# Patient Record
Sex: Male | Born: 1965
Health system: Southern US, Community
[De-identification: ages and names within clinical notes are randomized; demographics above are authoritative.]

## PROBLEM LIST (undated history)

## (undated) DIAGNOSIS — I509 Heart failure, unspecified: Secondary | ICD-10-CM

## (undated) DIAGNOSIS — F329 Major depressive disorder, single episode, unspecified: Secondary | ICD-10-CM

## (undated) DIAGNOSIS — F32A Depression, unspecified: Secondary | ICD-10-CM

## (undated) DIAGNOSIS — I429 Cardiomyopathy, unspecified: Secondary | ICD-10-CM

## (undated) HISTORY — PX: HAND SURGERY: SHX662

## (undated) HISTORY — PX: CARDIAC DEFIBRILLATOR PLACEMENT: SHX171

---

## 2013-12-27 ENCOUNTER — Emergency Department (HOSPITAL_COMMUNITY)
Admission: EM | Admit: 2013-12-27 | Discharge: 2013-12-27 | Disposition: A | Discharge status: Home / Self Care | Payer: Medicare (Managed Care) | Attending: Emergency Medicine | Admitting: Emergency Medicine

## 2013-12-27 ENCOUNTER — Encounter (HOSPITAL_COMMUNITY): Payer: Self-pay | Admitting: Emergency Medicine

## 2013-12-27 DIAGNOSIS — F3289 Other specified depressive episodes: Secondary | ICD-10-CM | POA: Insufficient documentation

## 2013-12-27 DIAGNOSIS — I428 Other cardiomyopathies: Secondary | ICD-10-CM | POA: Diagnosis not present

## 2013-12-27 DIAGNOSIS — R209 Unspecified disturbances of skin sensation: Secondary | ICD-10-CM | POA: Diagnosis present

## 2013-12-27 DIAGNOSIS — R208 Other disturbances of skin sensation: Secondary | ICD-10-CM

## 2013-12-27 DIAGNOSIS — Z7982 Long term (current) use of aspirin: Secondary | ICD-10-CM | POA: Diagnosis not present

## 2013-12-27 DIAGNOSIS — F329 Major depressive disorder, single episode, unspecified: Secondary | ICD-10-CM | POA: Diagnosis not present

## 2013-12-27 DIAGNOSIS — Z79899 Other long term (current) drug therapy: Secondary | ICD-10-CM | POA: Diagnosis not present

## 2013-12-27 DIAGNOSIS — I509 Heart failure, unspecified: Secondary | ICD-10-CM | POA: Insufficient documentation

## 2013-12-27 DIAGNOSIS — Z95810 Presence of automatic (implantable) cardiac defibrillator: Secondary | ICD-10-CM | POA: Insufficient documentation

## 2013-12-27 HISTORY — DX: Heart failure, unspecified: I50.9

## 2013-12-27 HISTORY — DX: Major depressive disorder, single episode, unspecified: F32.9

## 2013-12-27 HISTORY — DX: Depression, unspecified: F32.A

## 2013-12-27 HISTORY — DX: Cardiomyopathy, unspecified: I42.9
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
## 2013-12-27 NOTE — ED Notes (Signed)
ICD integrated and information transmitted via phone line
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
## 2015-03-12 DIAGNOSIS — Z95810 Presence of automatic (implantable) cardiac defibrillator: Secondary | ICD-10-CM

## 2016-09-06 ENCOUNTER — Ambulatory Visit: Payer: BLUE CROSS/BLUE SHIELD

## 2016-09-06 ENCOUNTER — Ambulatory Visit: Payer: MEDICARE

## 2016-09-06 DIAGNOSIS — M2021 Hallux rigidus, right foot: Secondary | ICD-10-CM

## 2016-09-06 DIAGNOSIS — M79671 Pain in right foot: Secondary | ICD-10-CM

## 2016-11-01 MED ORDER — METOPROLOL SUCCINATE ER 50 MG PO TB24
ORAL_TABLET | 0 refills
Start: 2016-11-01 — End: ?

## 2016-11-08 MED ORDER — METOPROLOL SUCCINATE ER 50 MG PO TB24
ORAL_TABLET | 0 refills
Start: 2016-11-08 — End: ?

## 2016-11-11 ENCOUNTER — Encounter (HOSPITAL_COMMUNITY): Payer: Self-pay | Admitting: Emergency Medicine

## 2016-11-11 ENCOUNTER — Emergency Department (HOSPITAL_COMMUNITY): Payer: Medicare Other

## 2016-11-11 ENCOUNTER — Emergency Department (HOSPITAL_COMMUNITY)
Admission: EM | Admit: 2016-11-11 | Discharge: 2016-11-11 | Disposition: A | Discharge status: Home / Self Care | Payer: Medicare Other | Attending: Emergency Medicine | Admitting: Emergency Medicine

## 2016-11-11 DIAGNOSIS — I509 Heart failure, unspecified: Secondary | ICD-10-CM | POA: Diagnosis not present

## 2016-11-11 DIAGNOSIS — Z4502 Encounter for adjustment and management of automatic implantable cardiac defibrillator: Secondary | ICD-10-CM

## 2016-11-11 DIAGNOSIS — R072 Precordial pain: Secondary | ICD-10-CM

## 2016-11-11 DIAGNOSIS — T82191A Other mechanical complication of cardiac pulse generator (battery), initial encounter: Secondary | ICD-10-CM | POA: Insufficient documentation

## 2016-11-11 DIAGNOSIS — Z45010 Encounter for checking and testing of cardiac pacemaker pulse generator [battery]: Secondary | ICD-10-CM

## 2016-11-11 DIAGNOSIS — Y712 Prosthetic and other implants, materials and accessory cardiovascular devices associated with adverse incidents: Secondary | ICD-10-CM | POA: Diagnosis not present

## 2016-11-11 DIAGNOSIS — Z79899 Other long term (current) drug therapy: Secondary | ICD-10-CM | POA: Insufficient documentation

## 2016-11-11 DIAGNOSIS — Z7982 Long term (current) use of aspirin: Secondary | ICD-10-CM | POA: Insufficient documentation

## 2016-11-11 LAB — BASIC METABOLIC PANEL
ANION GAP: 9 (ref 5–15)
BUN: 9 mg/dL (ref 6–20)
CO2: 31 mmol/L (ref 22–32)
Calcium: 9.1 mg/dL (ref 8.9–10.3)
Chloride: 99 mmol/L — ABNORMAL LOW (ref 101–111)
Creatinine, Ser: 0.84 mg/dL (ref 0.61–1.24)
GFR calc Af Amer: >60 mL/min (ref >60)
GFR calc non Af Amer: >60 mL/min (ref >60)
GLUCOSE: 111 mg/dL — AB (ref 65–99)
Potassium: 3.4 mmol/L — ABNORMAL LOW (ref 3.5–5.1)
Sodium: 139 mmol/L (ref 135–145)

## 2016-11-11 LAB — CBC
HEMATOCRIT: 44.7 % (ref 39.0–52.0)
HEMOGLOBIN: 15 g/dL (ref 13.0–17.0)
MCH: 29.9 pg (ref 26.0–34.0)
MCHC: 33.6 g/dL (ref 30.0–36.0)
MCV: 89 fL (ref 78.0–100.0)
Platelets: 224 10*3/uL (ref 150–400)
RBC: 5.02 MIL/uL (ref 4.22–5.81)
RDW: 12.7 % (ref 11.5–15.5)
WBC: 5.5 10*3/uL (ref 4.0–10.5)

## 2016-11-11 LAB — I-STAT TROPONIN, ED: Troponin i, poc: 0 ng/mL (ref 0.00–0.08)

## 2016-11-11 MED ORDER — METOPROLOL SUCCINATE ER 50 MG PO TB24: 50.0000 mg | ORAL_TABLET | Freq: Every day | ORAL | 0 refills | Status: DC

## 2016-11-11 NOTE — ED Triage Notes (Signed)
Pt sts defib was beeping today x 3-4 times; pt sts became anxious and had some CP earlier now resolved

## 2016-11-11 NOTE — ED Notes (Signed)
Pt stable, ambulatory, states understanding of discharge instructions 

## 2016-11-11 NOTE — Progress Notes (Signed)
Called regarding this patient who presented with a beeping Medtronic AICD. Interrogation confirms less than 3 months battery life left. Per the EMS physician report, he has not seen his cardiologist in [place] for 2 years. He is apparently in [place] caring for his relatives. Replacement of the device is recommended, but not emergency. Our office will contact him tomorrow to schedule an appointment with one of our electrophysiologists in the device clinic. Ok to d/c from our standpoint.  Chrystie Nose, MD, [facility]  [place]  [facility]  Attending Cardiologist  Direct Dial: [phone]  Fax: [phone]  Website:  [email or web address]

## 2016-11-11 NOTE — ED Notes (Signed)
[employer]tronic preliminary report states *Frequent PVC's *Possible minor amount of fluid *Alarm at 2:15am indicating recommended replace *Nothing else substantial since Nov/2017 rate went into the 190's

## 2016-11-11 NOTE — ED Provider Notes (Signed)
[facility]-EMERGENCY DEPT Provider Note   CSN: [id number] Arrival date & time: 11/11/16  1431     History   Chief Complaint Chief Complaint  Patient presents with  . defib beeping  . Chest Pain    HPI Dennis Mason is a 51 y.o. male.  Dennis Mason is a 51 y.o. Male with a history of CHF, cardiomyopathy, Medronic AICD, who presents to the emergency department complaining of his AICD beeping today. He reports this began about 2 hours prior to arrival. He reports after he heard something evening he began having some right-sided chest pain. Reports currently his chest pain is resolved. He reports he's been feeling very stressed and anxious lately. He is visiting from [place]. He is visiting family. He had a cardiologist and [place], but has not seen them in a very long time. He also tells me he ran out of his metoprolol about 10 or more days ago. He tells me he is very anxious about this beeping pacemaker. He denies fevers, coughing, shocklike feeling, abdominal pain, nausea, vomiting, shortness of breath, lightheadedness, dizziness or syncope.   The history is provided by the patient and medical records. No language interpreter was used.  Chest Pain   Pertinent negatives include no abdominal pain, no back pain, no cough, no fever, no headaches, no nausea, no palpitations, no shortness of breath, no vomiting and no weakness.    Past Medical History:  Diagnosis Date  . Cardiomyopathy (HCC)   . CHF (congestive heart failure) (HCC)   . Depression     There are no active problems to display for this patient.   Past Surgical History:  Procedure Laterality Date  . CARDIAC DEFIBRILLATOR PLACEMENT     also pacemaker  . HAND SURGERY Right    reconstruction       Home Medications    Prior to Admission medications   Medication Sig Start Date End Date Taking? Authorizing Provider  aspirin 81 MG tablet Take 81 mg by mouth daily.    [provider]  atorvastatin  (LIPITOR) 40 MG tablet Take 40 mg by mouth daily.    [provider]  escitalopram (LEXAPRO) 20 MG tablet Take 20 mg by mouth daily.    [provider]  metoprolol succinate (TOPROL-XL) 50 MG 24 hr tablet Take 50 mg by mouth daily. Take with or immediately following a meal.    [provider]  Multiple Vitamin (MULTIVITAMIN WITH MINERALS) TABS tablet Take 1 tablet by mouth daily.    [provider]  Omega-3 Fatty Acids (FISH OIL PO) Take 1 tablet by mouth 3 (three) times daily.    [provider]  ramipril (ALTACE) 2.5 MG capsule Take 2.5 mg by mouth daily.    [provider]  zolpidem (AMBIEN) 10 MG tablet Take 10 mg by mouth at bedtime as needed for sleep.    [provider]    Family History History reviewed. No pertinent family history.  Social History Social History  Substance Use Topics  . Smoking status: Never Smoker  . Smokeless tobacco: Not on file  . Alcohol use No     Allergies   Percocet [oxycodone-acetaminophen]   Review of Systems Review of Systems  Constitutional: Negative for chills and fever.  HENT: Negative for congestion and sore throat.   Eyes: Negative for visual disturbance.  Respiratory: Negative for cough, shortness of breath and wheezing.   Cardiovascular: Positive for chest pain. Negative for palpitations and leg swelling.  Gastrointestinal:  Negative for abdominal pain, nausea and vomiting.  Genitourinary: Negative for dysuria.  Musculoskeletal: Negative for back pain and neck pain.  Skin: Negative for rash.  Neurological: Negative for syncope, weakness, light-headedness and headaches.     Physical Exam Updated Vital Signs BP 111/75   Pulse (!) 41   Temp 98.6 F (37 C) (Oral)   Resp (!) 21   SpO2 97%   Physical Exam  Constitutional: He is oriented to person, place, and time. He appears well-developed and well-nourished. No distress.  Nontoxic appearing. Anxious appearing.    HENT:  Head: Normocephalic and atraumatic.  Mouth/Throat: Oropharynx is clear and moist.  Eyes: Pupils are equal, round, and reactive to light. Conjunctivae are normal. Right eye exhibits no discharge. Left eye exhibits no discharge.  Neck: Neck supple. No JVD present.  Cardiovascular: Regular rhythm, normal heart sounds and intact distal pulses.  Exam reveals no gallop and no friction rub.   No murmur heard. Irregular rhythm. Heart rate 80 on exam.  Pulmonary/Chest: Effort normal and breath sounds normal. No stridor. No respiratory distress. He has no wheezes. He has no rales.  Lungs are clear to ascultation bilaterally. Symmetric chest expansion bilaterally. No increased work of breathing. No rales or rhonchi.    Abdominal: Soft. There is no tenderness.  Musculoskeletal: He exhibits no edema or tenderness.  No lower extremity edema or tenderness.  Lymphadenopathy:    He has no cervical adenopathy.  Neurological: He is alert and oriented to person, place, and time. Coordination normal.  Skin: Skin is warm and dry. Capillary refill takes less than 2 seconds. No rash noted. He is not diaphoretic. No erythema. No pallor.  Psychiatric: His behavior is normal. His mood appears anxious.  Patient appears very anxious.   Nursing note and vitals reviewed.    ED Treatments / Results  Labs (all labs ordered are listed, but only abnormal results are displayed) Labs Reviewed  BASIC METABOLIC PANEL - Abnormal; Notable for the following:       Result Value   Potassium 3.4 (*)    Chloride 99 (*)    Glucose, Bld 111 (*)    All other components within normal limits  CBC  I-STAT TROPOININ, ED    EKG  EKG Interpretation None       Radiology Dg Chest 2 View  Result Date: 11/11/2016 CLINICAL DATA:  Chest pain EXAM: CHEST  2 VIEW COMPARISON:  None. FINDINGS: There is no focal parenchymal opacity. There is no pleural effusion or pneumothorax. The heart and mediastinal contours are  unremarkable. There is a single lead cardiac pacemaker. The osseous structures are unremarkable. IMPRESSION: No active cardiopulmonary disease. Electronically Signed   By: Elige Ko   On: 11/11/2016 15:38    Procedures Procedures (including critical care time)  Medications Ordered in ED Medications - No data to display   Initial Impression / Assessment and Plan / ED Course  I have reviewed the triage vital signs and the nursing notes.  Pertinent labs & imaging results that were available during my care of the patient were reviewed by me and considered in my medical decision making (see chart for details).  Clinical Course as of Nov 11 1901  Thu Nov 11, 2016  1716 [employer] called with preliminary pacemaker report. Beeping is due to recommendation of battery replacement. The pacemaker has not been interrogated in more than 2 years. Frequent PVCs. Otherwise unremarkable.   [WD]    Clinical Course User Index [WD] Everlene Farrier, PA-C  This is a 52 y.o. Male with a history of CHF, cardiomyopathy, [employer] AICD, who presents to the emergency department complaining of his AICD beeping today. He reports this began about 2 hours prior to arrival. He reports after he heard something evening he began having some right-sided chest pain. Reports currently his chest pain is resolved. He reports he's been feeling very stressed and anxious lately. He is visiting from [place]. He is visiting family. He had a cardiologist and [place], but has not seen them in a very long time.  On exam the patient is afebrile nontoxic appearing. EKG shows bigeminy. Patient reports this is typical for him. He no longer has any chest pain on my evaluation. He appears anxious. CP started after beeping started. Suspect a component of anxiety. No current chest pain.   Troponin is not elevated. BMP and CBC are unremarkable. Chest x-ray shows no acute findings.  [employer] called with preliminary pacemaker report.  Beeping is due to recommendation of battery replacement. The pacemaker has not been interrogated in more than 2 years. Frequent PVCs. Otherwise unremarkable. The estimated time of battery remaining is about 3 months.  I discussed this with the patient. He tells me he would prefer to go home today and schedule replacement of his pacemaker as an outpatient. I will consult with cardiology.  I called and consulted with cardiologist Dr. Rennis Golden. He advises as the patient has about 3 months remaining of his battery he is okay to follow-up as an outpatient. His office will contact him tomorrow to make an appointment for him to follow-up.  Patient is very happy about this plan. We'll discharge at this time with follow-up with Dr. Rennis Golden.  I advised the patient to follow-up with their primary care provider this week. I advised the patient to return to the emergency department with new or worsening symptoms or new concerns. The patient verbalized understanding and agreement with plan.    This patient was discussed with Dr. Rosalia Hammers who agrees with assessment and plan.   Final Clinical Impressions(s) / ED Diagnoses   Final diagnoses:  Pacemaker battery depletion  Precordial pain    New Prescriptions New Prescriptions   No medications on file     Lorene Dy 11/11/16 1906    Margarita Grizzle, MD 11/12/16 1440

## 2016-11-11 NOTE — ED Notes (Signed)
ED Provider at bedside. 

## 2016-11-17 ENCOUNTER — Encounter: Payer: Medicare Other | Admitting: Internal Medicine

## 2016-11-23 ENCOUNTER — Ambulatory Visit (INDEPENDENT_AMBULATORY_CARE_PROVIDER_SITE_OTHER): Payer: Medicare Other | Admitting: Cardiology

## 2016-11-23 ENCOUNTER — Encounter: Payer: Self-pay | Admitting: *Deleted

## 2016-11-23 ENCOUNTER — Other Ambulatory Visit: Payer: Self-pay | Admitting: Cardiology

## 2016-11-23 ENCOUNTER — Encounter: Payer: Self-pay | Admitting: Cardiology

## 2016-11-23 ENCOUNTER — Encounter (INDEPENDENT_AMBULATORY_CARE_PROVIDER_SITE_OTHER): Payer: Self-pay

## 2016-11-23 VITALS — BP 132/72 | HR 82 | Ht 66.0 in | Wt 154.4 lb

## 2016-11-23 DIAGNOSIS — Z01812 Encounter for preprocedural laboratory examination: Secondary | ICD-10-CM

## 2016-11-23 DIAGNOSIS — I428 Other cardiomyopathies: Secondary | ICD-10-CM

## 2016-11-23 DIAGNOSIS — E782 Mixed hyperlipidemia: Secondary | ICD-10-CM

## 2016-11-23 DIAGNOSIS — I493 Ventricular premature depolarization: Secondary | ICD-10-CM | POA: Diagnosis not present

## 2016-11-23 DIAGNOSIS — I5022 Chronic systolic (congestive) heart failure: Secondary | ICD-10-CM

## 2016-11-23 LAB — CUP PACEART INCLINIC DEVICE CHECK
Battery Voltage: 2.6 V
Brady Statistic RV Percent Paced: 0 %
HighPow Impedance: 49 Ohm
HighPow Impedance: 61 Ohm
Implantable Lead Implant Date: 20090129
Implantable Lead Model: 6947
Lead Channel Impedance Value: 342 Ohm
Lead Channel Pacing Threshold Amplitude: 3.5 V
Lead Channel Setting Pacing Amplitude: 5 V
Lead Channel Setting Pacing Pulse Width: 1 ms
Lead Channel Setting Sensing Sensitivity: 0.45 mV
MDC IDC LEAD LOCATION: 753860
MDC IDC MSMT LEADCHNL RV PACING THRESHOLD PULSEWIDTH: 1 ms
MDC IDC MSMT LEADCHNL RV SENSING INTR AMPL: 5.125 mV
MDC IDC PG IMPLANT DT: 20090129
MDC IDC SESS DTM: 20180724120814

## 2016-11-23 NOTE — Progress Notes (Signed)
Electrophysiology Office Note   Date:  11/23/2016   ID:  [name], [name] 1965-08-19, MRN [id number]  PCP:  System, Pcp Not In  Cardiologist:  [facility] Primary Electrophysiologist:  Leyton Magoon Jorja Loa, MD    Chief Complaint  Patient presents with  . Congestive Heart Failure     History of Present Illness: Dennis Mason is a 51 y.o. male who is being seen today for the evaluation of CHF, ICD at the request of Jori Moll. Presenting today for electrophysiology evaluation. As a history of congestive heart failure status post Medtronic single-chamber ICD. He presented to the emergency room on 11/11/16 after his ICD was beeping. He has not seen his cardiologist in [place] for the last 2 years.  Today, he denies symptoms of palpitations, chest pain, shortness of breath, orthopnea, PND, lower extremity edema, claudication, dizziness, presyncope, syncope, bleeding, or neurologic sequela. The patient is tolerating medications without difficulties.    Past Medical History:  Diagnosis Date  . Cardiomyopathy (HCC)   . CHF (congestive heart failure) (HCC)   . Depression    Past Surgical History:  Procedure Laterality Date  . CARDIAC DEFIBRILLATOR PLACEMENT     also pacemaker  . HAND SURGERY Right    reconstruction     Current Outpatient Prescriptions  Medication Sig Dispense Refill  . aspirin 81 MG tablet Take 81 mg by mouth daily.    Marland Kitchen atorvastatin (LIPITOR) 40 MG tablet Take 40 mg by mouth daily.    Marland Kitchen escitalopram (LEXAPRO) 20 MG tablet Take 20 mg by mouth daily.    . metoprolol succinate (TOPROL-XL) 50 MG 24 hr tablet Take 1 tablet (50 mg total) by mouth daily. Take with or immediately following a meal. 30 tablet 0  . zolpidem (AMBIEN) 10 MG tablet Take 10 mg by mouth at bedtime as needed for sleep.     No current facility-administered medications for this visit.     Allergies:   Percocet [oxycodone-acetaminophen]   Social History:  The patient  reports that he has  never smoked. He does not have any smokeless tobacco history on file. He reports that he does not drink alcohol or use drugs.   Family History:  The patient's family history includes Heart Problems in his mother.    ROS:  Please see the history of present illness.   Otherwise, review of systems is positive for palpitations.   All other systems are reviewed and negative.    PHYSICAL EXAM: VS:  BP 132/72   Pulse 82   Ht 5\' 6"  (1.676 m)   Wt 154 lb 6 oz (70 kg)   SpO2 96%   BMI 24.92 kg/m  , BMI Body mass index is 24.92 kg/m. GEN: Well nourished, well developed, in no acute distress  HEENT: normal  Neck: no JVD, carotid bruits, or masses Cardiac: RRR; no murmurs, rubs, or gallops,no edema  Respiratory:  clear to auscultation bilaterally, normal work of breathing GI: soft, nontender, nondistended, + BS MS: no deformity or atrophy  Skin: warm and dry, device pocket is well healed Neuro:  Strength and sensation are intact Psych: euthymic mood, full affect  EKG:  EKG is ordered today. Personal review of the ekg ordered shows sinus rhythm, first-degree AV block, frequent PVCs   Device interrogation is reviewed today in detail.  See PaceArt for details.   Recent Labs: 11/11/2016: BUN 9; Creatinine, Ser 0.84; Hemoglobin 15.0; Platelets 224; Potassium 3.4; Sodium 139    Lipid Panel  No results  found for: CHOL, TRIG, HDL, CHOLHDL, VLDL, LDLCALC, LDLDIRECT   Wt Readings from Last 3 Encounters:  11/23/16 154 lb 6 oz (70 kg)      Other studies Reviewed: Additional studies/ records that were reviewed today include: TTE  Review of the above records today demonstrates: 05/13/15 LV normal size mildly reduced EF 45%. Apical and global hypokinesis with dyssynchrony due to pacing.     ASSESSMENT AND PLAN:  1.  Cardiomyopathy: This post Medtronic single-chamber ICD. Currently on metoprolol, but no ACE inhibitor. His device is at [facility]. We'll plan for generator change. Risks and benefits  were discussed. Risks include bleeding and infection among others. He has agreed to the procedure.  2. Hyperlipidemia: Continue atorvastatin  3. PVCs: We'll plan to follow-up with primary cardiologist in [place].    Current medicines are reviewed at length with the patient today.   The patient does not have concerns regarding his medicines.  The following changes were made today:  none  Labs/ tests ordered today include:  No orders of the defined types were placed in this encounter.    Disposition:   FU with Joslin Doell 3 months  Signed, Tana Trefry Jorja Loa, MD  11/23/2016 12:27 PM     [facility] [address] [place] [address] [phone] (office) [phone] (fax)

## 2016-11-23 NOTE — Patient Instructions (Addendum)
Medication Instructions:    Your physician recommends that you continue on your current medications as directed. Please refer to the Current Medication list given to you today.  - If you need a refill on your cardiac medications before your next appointment, please call your pharmacy.   Labwork:  Pre procedure labs today: BMET & CBC w/ diff  Testing/Procedures: Your physician has recommended that you have a defibrillator generator change. Please see the instruction sheet given to you today for more information.  Follow-Up:  Your physician recommends that you schedule a follow-up appointment in: 10-14 days, after your procedure on 12/02/16, with device clinic for a wound check.   You need to follow up with your cardiologist when you return to [place].  Thank you for choosing [facility]!!   Dory Horn, RN [phone]

## 2016-11-24 LAB — CBC WITH DIFFERENTIAL/PLATELET
Basophils Absolute: 0.1 10*3/uL (ref 0.0–0.2)
Basos: 1 %
EOS (ABSOLUTE): 0.2 10*3/uL (ref 0.0–0.4)
Eos: 3 %
HEMOGLOBIN: 14.7 g/dL (ref 13.0–17.7)
Hematocrit: 41.5 % (ref 37.5–51.0)
Immature Grans (Abs): 0 10*3/uL (ref 0.0–0.1)
Immature Granulocytes: 0 %
LYMPHS ABS: 1.5 10*3/uL (ref 0.7–3.1)
Lymphs: 24 %
MCH: 29.8 pg (ref 26.6–33.0)
MCHC: 35.4 g/dL (ref 31.5–35.7)
MCV: 84 fL (ref 79–97)
MONOCYTES: 9 %
Monocytes Absolute: 0.6 10*3/uL (ref 0.1–0.9)
NEUTROS ABS: 3.9 10*3/uL (ref 1.4–7.0)
Neutrophils: 63 %
Platelets: 272 10*3/uL (ref 150–379)
RBC: 4.93 x10E6/uL (ref 4.14–5.80)
RDW: 13.1 % (ref 12.3–15.4)
WBC: 6.3 10*3/uL (ref 3.4–10.8)

## 2016-11-24 LAB — BASIC METABOLIC PANEL
BUN / CREAT RATIO: 19 (ref 9–20)
BUN: 16 mg/dL (ref 6–24)
CO2: 32 mmol/L — ABNORMAL HIGH (ref 20–29)
CREATININE: 0.84 mg/dL (ref 0.76–1.27)
Calcium: 10.3 mg/dL — ABNORMAL HIGH (ref 8.7–10.2)
Chloride: 95 mmol/L — ABNORMAL LOW (ref 96–106)
GFR, EST AFRICAN AMERICAN: 117 mL/min/{1.73_m2} (ref >59)
GFR, EST NON AFRICAN AMERICAN: 101 mL/min/{1.73_m2} (ref >59)
GLUCOSE: 95 mg/dL (ref 65–99)
Potassium: 3.7 mmol/L (ref 3.5–5.2)
Sodium: 143 mmol/L (ref 134–144)

## 2016-12-02 ENCOUNTER — Ambulatory Visit (HOSPITAL_COMMUNITY)
Admission: RE | Admit: 2016-12-02 | Discharge: 2016-12-02 | Disposition: A | Discharge status: Home / Self Care | Payer: Medicare Other | Source: Ambulatory Visit | Attending: Cardiology | Admitting: Cardiology

## 2016-12-02 ENCOUNTER — Encounter (HOSPITAL_COMMUNITY)
Admission: RE | Disposition: A | Discharge status: Home / Self Care | Payer: Self-pay | Source: Ambulatory Visit | Attending: Cardiology

## 2016-12-02 DIAGNOSIS — Z01818 Encounter for other preprocedural examination: Secondary | ICD-10-CM | POA: Diagnosis not present

## 2016-12-02 DIAGNOSIS — E785 Hyperlipidemia, unspecified: Secondary | ICD-10-CM | POA: Diagnosis not present

## 2016-12-02 DIAGNOSIS — I5022 Chronic systolic (congestive) heart failure: Secondary | ICD-10-CM

## 2016-12-02 DIAGNOSIS — I493 Ventricular premature depolarization: Secondary | ICD-10-CM | POA: Insufficient documentation

## 2016-12-02 DIAGNOSIS — Z4502 Encounter for adjustment and management of automatic implantable cardiac defibrillator: Secondary | ICD-10-CM | POA: Insufficient documentation

## 2016-12-02 DIAGNOSIS — Z7982 Long term (current) use of aspirin: Secondary | ICD-10-CM | POA: Insufficient documentation

## 2016-12-02 DIAGNOSIS — I429 Cardiomyopathy, unspecified: Secondary | ICD-10-CM

## 2016-12-02 DIAGNOSIS — I428 Other cardiomyopathies: Secondary | ICD-10-CM

## 2016-12-02 DIAGNOSIS — Z79899 Other long term (current) drug therapy: Secondary | ICD-10-CM | POA: Diagnosis not present

## 2016-12-02 DIAGNOSIS — I509 Heart failure, unspecified: Secondary | ICD-10-CM | POA: Diagnosis not present

## 2016-12-02 HISTORY — PX: ICD GENERATOR CHANGEOUT: EP1231

## 2016-12-02 LAB — SURGICAL PCR SCREEN
MRSA, PCR: NEGATIVE
STAPHYLOCOCCUS AUREUS: POSITIVE — AB

## 2016-12-02 SURGERY — ICD GENERATOR CHANGEOUT

## 2016-12-02 MED ORDER — SODIUM CHLORIDE 0.9 % IV SOLN
INTRAVENOUS | Status: DC
  Administered 2016-12-02: 09:00:00 via INTRAVENOUS

## 2016-12-02 MED ORDER — ONDANSETRON HCL 4 MG/2ML IJ SOLN: 4.0000 mg | Freq: Four times a day (QID) | INTRAMUSCULAR | Status: DC | PRN

## 2016-12-02 MED ORDER — MUPIROCIN 2 % EX OINT
TOPICAL_OINTMENT | CUTANEOUS | Status: AC
  Administered 2016-12-02: 1 via TOPICAL
  Filled 2016-12-02: qty 22

## 2016-12-02 MED ORDER — CEFAZOLIN SODIUM-DEXTROSE 1-4 GM/50ML-% IV SOLN
1.0000 g | Freq: Four times a day (QID) | INTRAVENOUS | Status: DC
Start: 2016-12-02 — End: 2016-12-02

## 2016-12-02 MED ORDER — LIDOCAINE HCL (PF) 1 % IJ SOLN
INTRAMUSCULAR | Status: AC
  Filled 2016-12-02: qty 60

## 2016-12-02 MED ORDER — MIDAZOLAM HCL 5 MG/5ML IJ SOLN
INTRAMUSCULAR | Status: AC
  Filled 2016-12-02: qty 5

## 2016-12-02 MED ORDER — LIDOCAINE HCL (PF) 1 % IJ SOLN
INTRAMUSCULAR | Status: DC | PRN
  Administered 2016-12-02: 45 mL

## 2016-12-02 MED ORDER — CEFAZOLIN SODIUM-DEXTROSE 2-4 GM/100ML-% IV SOLN
2.0000 g | INTRAVENOUS | Status: AC
  Administered 2016-12-02: 2 g via INTRAVENOUS
  Filled 2016-12-02: qty 100

## 2016-12-02 MED ORDER — SODIUM CHLORIDE 0.9 % IR SOLN
Status: AC
  Filled 2016-12-02: qty 2

## 2016-12-02 MED ORDER — MUPIROCIN 2 % EX OINT
1.0000 "application " | TOPICAL_OINTMENT | Freq: Once | CUTANEOUS | Status: AC
  Administered 2016-12-02: 1 via TOPICAL
  Filled 2016-12-02: qty 22

## 2016-12-02 MED ORDER — SODIUM CHLORIDE 0.9 % IR SOLN
80.0000 mg | Status: AC
  Administered 2016-12-02: 80 mg
  Filled 2016-12-02: qty 2

## 2016-12-02 MED ORDER — CEFAZOLIN SODIUM-DEXTROSE 2-4 GM/100ML-% IV SOLN
INTRAVENOUS | Status: AC
  Filled 2016-12-02: qty 100

## 2016-12-02 MED ORDER — MIDAZOLAM HCL 5 MG/5ML IJ SOLN
INTRAMUSCULAR | Status: DC | PRN
  Administered 2016-12-02 (×3): 1 mg via INTRAVENOUS

## 2016-12-02 SURGICAL SUPPLY — 4 items
CABLE SURGICAL S-101-97-12 (CABLE) ×3 IMPLANT
ICD VISIA MRI DVFB1D1 (ICD Generator) ×3 IMPLANT
PAD DEFIB LIFELINK (PAD) ×3 IMPLANT
TRAY PACEMAKER INSERTION (PACKS) ×3 IMPLANT

## 2016-12-02 NOTE — H&P (Signed)
Dennis Mason is a 51 y.o. male with a history of nonischemic cardiomyopathy. He presents for ICD generator change. On exam, regular rhythm, no murmurs, lungs clear. Risks and benefits discussed. Risks include but not limited to bleeding and infection. Patient understands the risks and has agreed to the procedure.  Will Elberta Fortis, MD 12/02/2016 7:40 AM

## 2016-12-02 NOTE — Discharge Instructions (Signed)

## 2016-12-03 ENCOUNTER — Encounter (HOSPITAL_COMMUNITY): Payer: Self-pay | Admitting: Cardiology

## 2016-12-04 ENCOUNTER — Emergency Department (HOSPITAL_COMMUNITY): Payer: Medicare Other

## 2016-12-04 ENCOUNTER — Emergency Department (HOSPITAL_COMMUNITY)
Admission: EM | Admit: 2016-12-04 | Discharge: 2016-12-04 | Disposition: A | Discharge status: Home / Self Care | Payer: Medicare Other | Attending: Emergency Medicine | Admitting: Emergency Medicine

## 2016-12-04 ENCOUNTER — Encounter (HOSPITAL_COMMUNITY): Payer: Self-pay | Admitting: *Deleted

## 2016-12-04 DIAGNOSIS — Z5189 Encounter for other specified aftercare: Secondary | ICD-10-CM | POA: Insufficient documentation

## 2016-12-04 DIAGNOSIS — E876 Hypokalemia: Secondary | ICD-10-CM | POA: Diagnosis not present

## 2016-12-04 DIAGNOSIS — Z950 Presence of cardiac pacemaker: Secondary | ICD-10-CM | POA: Diagnosis not present

## 2016-12-04 DIAGNOSIS — I509 Heart failure, unspecified: Secondary | ICD-10-CM | POA: Diagnosis not present

## 2016-12-04 DIAGNOSIS — Z79899 Other long term (current) drug therapy: Secondary | ICD-10-CM | POA: Insufficient documentation

## 2016-12-04 DIAGNOSIS — Z7982 Long term (current) use of aspirin: Secondary | ICD-10-CM | POA: Diagnosis not present

## 2016-12-04 LAB — COMPREHENSIVE METABOLIC PANEL
ALBUMIN: 3.7 g/dL (ref 3.5–5.0)
ALK PHOS: 62 U/L (ref 38–126)
ALT: 25 U/L (ref 17–63)
ANION GAP: 8 (ref 5–15)
AST: 32 U/L (ref 15–41)
BILIRUBIN TOTAL: 0.7 mg/dL (ref 0.3–1.2)
BUN: 12 mg/dL (ref 6–20)
CALCIUM: 9 mg/dL (ref 8.9–10.3)
CO2: 33 mmol/L — ABNORMAL HIGH (ref 22–32)
Chloride: 100 mmol/L — ABNORMAL LOW (ref 101–111)
Creatinine, Ser: 0.81 mg/dL (ref 0.61–1.24)
GFR calc Af Amer: >60 mL/min (ref >60)
GLUCOSE: 102 mg/dL — AB (ref 65–99)
Potassium: 2.9 mmol/L — ABNORMAL LOW (ref 3.5–5.1)
Sodium: 141 mmol/L (ref 135–145)
TOTAL PROTEIN: 6.2 g/dL — AB (ref 6.5–8.1)

## 2016-12-04 LAB — CBC
HCT: 40.2 % (ref 39.0–52.0)
HEMOGLOBIN: 13.6 g/dL (ref 13.0–17.0)
MCH: 29.6 pg (ref 26.0–34.0)
MCHC: 33.8 g/dL (ref 30.0–36.0)
MCV: 87.6 fL (ref 78.0–100.0)
Platelets: 246 10*3/uL (ref 150–400)
RBC: 4.59 MIL/uL (ref 4.22–5.81)
RDW: 12.8 % (ref 11.5–15.5)
WBC: 6.5 10*3/uL (ref 4.0–10.5)

## 2016-12-04 LAB — I-STAT CG4 LACTIC ACID, ED: LACTIC ACID, VENOUS: 1.7 mmol/L (ref 0.5–1.9)

## 2016-12-04 MED ORDER — POTASSIUM CHLORIDE CRYS ER 20 MEQ PO TBCR
40.0000 meq | EXTENDED_RELEASE_TABLET | Freq: Once | ORAL | Status: AC
  Administered 2016-12-04: 40 meq via ORAL
  Filled 2016-12-04: qty 2

## 2016-12-04 NOTE — ED Notes (Signed)
Pt understood dc material. NAD noted. 

## 2016-12-04 NOTE — ED Provider Notes (Signed)
[facility]EM[facility]ENCY DEPT Provider Note   CSN: [id number] Arrival date & time: 12/04/16  0031     History   Chief Complaint Chief Complaint  Patient presents with  . Post-op Problem    HPI Dennis Mason is a 51 y.o. male.  The history is provided by the patient.    Patient presents for concern for wound infection He had ICD generator change by cardiology on 12/02/16 No acute issues at that time Today, he is concerned that he may have infection He has been helping his elderly father at home.  He reports he had to clean up feces from his father and also had to bathe his father and he is concerned about infection.  He did not get any soiled material on the wound.  He had a shirt on the whole time but does report he may have breathed on his wound.   He reports he has "staph in my nose"  No fever/chills/vomiting He reports mild soreness around wound but no significant pain No drainage No significant erythema is noted   Past Medical History:  Diagnosis Date  . Cardiomyopathy (HCC)   . CHF (congestive heart failure) (HCC)   . Depression     There are no active problems to display for this patient.   Past Surgical History:  Procedure Laterality Date  . CARDIAC DEFIBRILLATOR PLACEMENT     also pacemaker  . HAND SURGERY Right    reconstruction  . ICD GENERATOR CHANGEOUT N/A 12/02/2016   Procedure: ICD Generator Changeout;  Surgeon: Regan Lemming, MD;  Location: [facility] INVASIVE CV LAB;  Service: Cardiovascular;  Laterality: N/A;       Home Medications    Prior to Admission medications   Medication Sig Start Date End Date Taking? Authorizing Provider  aspirin 81 MG tablet Take 81 mg by mouth daily.    [provider]  atorvastatin (LIPITOR) 40 MG tablet Take 40 mg by mouth daily.    [provider]  escitalopram (LEXAPRO) 20 MG tablet Take 20 mg by mouth daily.    [provider]  metoprolol succinate (TOPROL-XL) 50 MG 24 hr tablet Take 1  tablet (50 mg total) by mouth daily. Take with or immediately following a meal. 11/11/16   Everlene Farrier, PA-C  zolpidem (AMBIEN) 10 MG tablet Take 10 mg by mouth at bedtime as needed for sleep.    [provider]    Family History Family History  Problem Relation Age of Onset  . Heart Problems Mother     Social History Social History  Substance Use Topics  . Smoking status: Never Smoker  . Smokeless tobacco: Never Used  . Alcohol use No     Allergies   Percocet [oxycodone-acetaminophen]   Review of Systems Review of Systems  Constitutional: Negative for fever.  Respiratory: Negative for shortness of breath.   Cardiovascular: Negative for chest pain.  Skin: Positive for wound.  All other systems reviewed and are negative.    Physical Exam Updated Vital Signs BP (!) 118/91   Pulse 60   Temp 98.5 F (36.9 C) (Oral)   Resp (!) 21   Ht 1.676 m (5\' 6" )   Wt 69.9 kg (154 lb)   SpO2 95%   BMI 24.86 kg/m   Physical Exam CONSTITUTIONAL: Well developed/well nourished HEAD: Normocephalic/atraumatic EYES: EOMI ENMT: Mucous membranes moist NECK: supple no meningeal signs CV: S1/S2 noted  LUNGS: Lungs are clear to auscultation bilaterally, no apparent distress Chest - see photo  No warmth, no crepitus, no drainage.  No foul smell noted ABDOMEN: soft, nontender  NEURO: Pt is awake/alert/appropriate, moves all extremitiesx4.   EXTREMITIES:   full ROM SKIN: warm, color normal PSYCH: no abnormalities of mood noted, alert and oriented to situation   Patient gave verbal permission to utilize photo for medical documentation only The image was not stored on any personal device   ED Treatments / Results  Labs (all labs ordered are listed, but only abnormal results are displayed) Labs Reviewed  COMPREHENSIVE METABOLIC PANEL - Abnormal; Notable for the following:       Result Value   Potassium 2.9 (*)    Chloride 100 (*)    CO2 33 (*)    Glucose, Bld 102  (*)    Total Protein 6.2 (*)    All other components within normal limits  CBC  I-STAT CG4 LACTIC ACID, ED    EKG  EKG Interpretation None       Radiology Dg Chest 2 View  Result Date: 12/04/2016 CLINICAL DATA:  Acute onset of swelling about the patient's recently placed defibrillator. Concern for infection. Initial encounter. EXAM: CHEST  2 VIEW COMPARISON:  Chest radiograph performed 11/11/2016 FINDINGS: The lungs are well-aerated and clear. There is no evidence of focal opacification, pleural effusion or pneumothorax. The heart is normal in size; the mediastinal contour is within normal limits. The patient's AICD is noted at the left chest wall, with a single lead ending at the right ventricle. It is grossly unremarkable, though the surrounding soft tissues are not well assessed on radiograph. No acute osseous abnormalities are seen. IMPRESSION: No acute cardiopulmonary process seen. AICD is noted at the left chest wall. Electronically Signed   By: Roanna Raider M.D.   On: 12/04/2016 03:03    Procedures Procedures (including critical care time)  Medications Ordered in ED Medications  potassium chloride SA (K-DUR,KLOR-CON) CR tablet 40 mEq (40 mEq Oral Given 12/04/16 0318)     Initial Impression / Assessment and Plan / ED Course  I have reviewed the triage vital signs and the nursing notes.  Pertinent labs & imaging results that were available during my care of the patient were reviewed by me and considered in my medical decision making (see chart for details).    No leukocytosis He is afebrile CXR unremarkable He has mild HYPOkalemia, oral dose given here Encouraged him to keep wound covered and to call his cardiologist in 48 hours He has wound visit scheduled later in month but he may benefit from recheck this upcoming week We discussed strict ER return precautions I also advised that he may be overdoing it with work at home and this may have caused discomfort, I Advised  him to rest   Final Clinical Impressions(s) / ED Diagnoses   Final diagnoses:  Visit for wound check  Hypokalemia    New Prescriptions New Prescriptions   No medications on file     Zadie Rhine, MD 12/04/16 [phone]

## 2016-12-04 NOTE — ED Triage Notes (Signed)
The pt had a pacemaker defribrillator placed yesterday tonight after cleaning up a large stool with his father he noticed that the wound was red and swollen  And he was afraid that the area was infected

## 2016-12-16 ENCOUNTER — Ambulatory Visit (INDEPENDENT_AMBULATORY_CARE_PROVIDER_SITE_OTHER): Payer: Medicare Other | Admitting: *Deleted

## 2016-12-16 DIAGNOSIS — I428 Other cardiomyopathies: Secondary | ICD-10-CM | POA: Diagnosis not present

## 2016-12-16 DIAGNOSIS — I5022 Chronic systolic (congestive) heart failure: Secondary | ICD-10-CM | POA: Diagnosis not present

## 2016-12-16 MED ORDER — METOPROLOL SUCCINATE ER 50 MG PO TB24: 50.0000 mg | ORAL_TABLET | Freq: Every day | ORAL | 1 refills | Status: DC

## 2016-12-17 LAB — CUP PACEART INCLINIC DEVICE CHECK
Battery Remaining Longevity: 137 mo
HIGH POWER IMPEDANCE MEASURED VALUE: 63 Ohm
HighPow Impedance: 46 Ohm
Implantable Lead Model: 6947
Implantable Pulse Generator Implant Date: 20180802
Lead Channel Pacing Threshold Pulse Width: 1 ms
Lead Channel Sensing Intrinsic Amplitude: 4.375 mV
Lead Channel Setting Pacing Amplitude: 6 V
Lead Channel Setting Pacing Pulse Width: 1 ms
MDC IDC LEAD IMPLANT DT: 20090129
MDC IDC LEAD LOCATION: 753860
MDC IDC MSMT BATTERY VOLTAGE: 3.14 V
MDC IDC MSMT LEADCHNL RV IMPEDANCE VALUE: 304 Ohm
MDC IDC MSMT LEADCHNL RV IMPEDANCE VALUE: 342 Ohm
MDC IDC MSMT LEADCHNL RV PACING THRESHOLD AMPLITUDE: 4.5 V
MDC IDC MSMT LEADCHNL RV SENSING INTR AMPL: 5.75 mV
MDC IDC SESS DTM: 20180816155033
MDC IDC SET LEADCHNL RV SENSING SENSITIVITY: 0.45 mV
MDC IDC STAT BRADY RV PERCENT PACED: 0.02 %

## 2016-12-17 NOTE — Progress Notes (Signed)
Wound check appointment. Steri-strips removed. Wound without redness or edema. Incision edges approximated, wound well healed. Normal device function. Threshold, sensing, and impedances consistent with implant measurements. Device programmed at appropriate safety margin. Histogram distribution appropriate for patient and level of activity. No AF episodes. No ventricular arrhythmias noted. Patient educated about wound care, arm mobility, and shock plan. Patient plans to f/u w/an EP in [place] since he will be moving back next month. Patient will have CL transferred once he's established.

## 2017-03-21 ENCOUNTER — Ambulatory Visit (INDEPENDENT_AMBULATORY_CARE_PROVIDER_SITE_OTHER): Payer: Medicare Other | Admitting: *Deleted

## 2017-03-21 ENCOUNTER — Telehealth: Payer: Self-pay | Admitting: Cardiology

## 2017-03-21 DIAGNOSIS — I428 Other cardiomyopathies: Secondary | ICD-10-CM | POA: Diagnosis not present

## 2017-03-21 NOTE — Telephone Encounter (Signed)
Spoke with pt and reminded pt of remote transmission that is due today. Pt verbalized understanding.   

## 2017-03-22 LAB — CUP PACEART REMOTE DEVICE CHECK
Battery Remaining Longevity: 135 mo
Battery Voltage: 3.12 V
HIGH POWER IMPEDANCE MEASURED VALUE: 45 Ohm
HighPow Impedance: 66 Ohm
Implantable Pulse Generator Implant Date: 20180802
Lead Channel Impedance Value: 304 Ohm
Lead Channel Impedance Value: 399 Ohm
Lead Channel Setting Pacing Pulse Width: 1 ms
Lead Channel Setting Sensing Sensitivity: 0.45 mV
MDC IDC LEAD IMPLANT DT: 20090129
MDC IDC LEAD LOCATION: 753860
MDC IDC MSMT LEADCHNL RV SENSING INTR AMPL: 4.125 mV
MDC IDC MSMT LEADCHNL RV SENSING INTR AMPL: 4.125 mV
MDC IDC SESS DTM: 20181119221415
MDC IDC SET LEADCHNL RV PACING AMPLITUDE: 6 V
MDC IDC STAT BRADY RV PERCENT PACED: 0.01 %

## 2017-03-22 NOTE — Progress Notes (Signed)
Remote ICD transmission.   

## 2017-03-31 ENCOUNTER — Encounter: Payer: Self-pay | Admitting: Cardiology

## 2017-05-30 MED ORDER — ESCITALOPRAM OXALATE 20 MG PO TABS
ORAL_TABLET | 1 refills
Start: 2017-05-30 — End: ?

## 2017-06-20 ENCOUNTER — Telehealth: Payer: Self-pay | Admitting: Cardiology

## 2017-06-20 ENCOUNTER — Encounter: Payer: Medicare Other | Admitting: *Deleted

## 2017-06-20 NOTE — Telephone Encounter (Signed)
LMOVM reminding pt to send remote transmission.   

## 2017-06-23 ENCOUNTER — Encounter: Payer: Self-pay | Admitting: Cardiology

## 2017-07-04 MED ORDER — ESCITALOPRAM OXALATE 20 MG PO TABS
ORAL_TABLET | 1 refills
Start: 2017-07-04 — End: ?

## 2017-10-25 ENCOUNTER — Encounter: Payer: Self-pay | Admitting: Cardiology

## 2017-11-25 ENCOUNTER — Encounter: Payer: Self-pay | Admitting: Cardiology

## 2017-12-03 LAB — CUP PACEART REMOTE DEVICE CHECK
Battery Voltage: 3.03 V
Brady Statistic RV Percent Paced: 0.02 %
Date Time Interrogation Session: 20190803051733
HIGH POWER IMPEDANCE MEASURED VALUE: 48 Ohm
HighPow Impedance: 75 Ohm
Implantable Lead Location: 753860
Implantable Lead Model: 6947
Lead Channel Impedance Value: 361 Ohm
Lead Channel Setting Pacing Amplitude: 6 V
Lead Channel Setting Pacing Pulse Width: 1 ms
Lead Channel Setting Sensing Sensitivity: 0.45 mV
MDC IDC LEAD IMPLANT DT: 20090129
MDC IDC MSMT BATTERY REMAINING LONGEVITY: 132 mo
MDC IDC MSMT LEADCHNL RV IMPEDANCE VALUE: 285 Ohm
MDC IDC MSMT LEADCHNL RV SENSING INTR AMPL: 3.375 mV
MDC IDC MSMT LEADCHNL RV SENSING INTR AMPL: 3.375 mV
MDC IDC PG IMPLANT DT: 20180802

## 2017-12-06 ENCOUNTER — Ambulatory Visit (INDEPENDENT_AMBULATORY_CARE_PROVIDER_SITE_OTHER): Payer: Medicare Other | Admitting: *Deleted

## 2017-12-06 DIAGNOSIS — I5022 Chronic systolic (congestive) heart failure: Secondary | ICD-10-CM

## 2017-12-06 DIAGNOSIS — I428 Other cardiomyopathies: Secondary | ICD-10-CM

## 2017-12-07 NOTE — Progress Notes (Signed)
Remote ICD transmission.   

## 2018-03-07 ENCOUNTER — Telehealth: Payer: Self-pay

## 2018-03-07 ENCOUNTER — Ambulatory Visit: Payer: Medicare Other | Admitting: *Deleted

## 2018-03-07 NOTE — Telephone Encounter (Signed)
LMOVM reminding pt to send remote transmission.   

## 2018-03-09 ENCOUNTER — Ambulatory Visit (INDEPENDENT_AMBULATORY_CARE_PROVIDER_SITE_OTHER): Payer: Medicare Other | Admitting: *Deleted

## 2018-03-09 DIAGNOSIS — I428 Other cardiomyopathies: Secondary | ICD-10-CM

## 2018-03-12 ENCOUNTER — Encounter: Payer: Self-pay | Admitting: Cardiology

## 2018-03-12 NOTE — Progress Notes (Signed)
Remote ICD transmission.   

## 2018-05-12 LAB — CUP PACEART REMOTE DEVICE CHECK
Battery Voltage: 3.01 V
Brady Statistic RV Percent Paced: 0.01 %
Date Time Interrogation Session: 20191107222403
HIGH POWER IMPEDANCE MEASURED VALUE: 66 Ohm
HighPow Impedance: 47 Ohm
Implantable Lead Implant Date: 20090129
Implantable Lead Location: 753860
Implantable Lead Model: 6947
Lead Channel Impedance Value: 304 Ohm
Lead Channel Sensing Intrinsic Amplitude: 3.125 mV
Lead Channel Sensing Intrinsic Amplitude: 3.125 mV
Lead Channel Setting Pacing Pulse Width: 1 ms
MDC IDC MSMT BATTERY REMAINING LONGEVITY: 130 mo
MDC IDC MSMT LEADCHNL RV IMPEDANCE VALUE: 361 Ohm
MDC IDC PG IMPLANT DT: 20180802
MDC IDC SET LEADCHNL RV PACING AMPLITUDE: 6 V
MDC IDC SET LEADCHNL RV SENSING SENSITIVITY: 0.45 mV

## 2018-06-08 ENCOUNTER — Ambulatory Visit (INDEPENDENT_AMBULATORY_CARE_PROVIDER_SITE_OTHER): Payer: Medicare Other

## 2018-06-08 DIAGNOSIS — I428 Other cardiomyopathies: Secondary | ICD-10-CM | POA: Diagnosis not present

## 2018-06-08 DIAGNOSIS — I5022 Chronic systolic (congestive) heart failure: Secondary | ICD-10-CM

## 2018-06-09 LAB — CUP PACEART REMOTE DEVICE CHECK
Battery Voltage: 3.01 V
Brady Statistic RV Percent Paced: 0 %
Date Time Interrogation Session: 20200206122406
HIGH POWER IMPEDANCE MEASURED VALUE: 47 Ohm
HighPow Impedance: 65 Ohm
Implantable Lead Location: 753860
Implantable Lead Model: 6947
Lead Channel Impedance Value: 361 Ohm
Lead Channel Sensing Intrinsic Amplitude: 5.75 mV
Lead Channel Setting Pacing Amplitude: 6 V
Lead Channel Setting Pacing Pulse Width: 1 ms
MDC IDC LEAD IMPLANT DT: 20090129
MDC IDC MSMT BATTERY REMAINING LONGEVITY: 128 mo
MDC IDC MSMT LEADCHNL RV IMPEDANCE VALUE: 304 Ohm
MDC IDC MSMT LEADCHNL RV SENSING INTR AMPL: 5.75 mV
MDC IDC PG IMPLANT DT: 20180802
MDC IDC SET LEADCHNL RV SENSING SENSITIVITY: 0.45 mV

## 2018-06-20 NOTE — Progress Notes (Signed)
Remote ICD transmission.   

## 2018-09-14 ENCOUNTER — Other Ambulatory Visit: Payer: Self-pay

## 2018-09-14 ENCOUNTER — Encounter: Payer: Medicare Other | Admitting: *Deleted

## 2018-09-15 ENCOUNTER — Telehealth: Payer: Self-pay

## 2018-09-15 NOTE — Telephone Encounter (Signed)
Left message for patient to remind of missed remote transmission.  

## 2018-09-21 ENCOUNTER — Encounter: Payer: Self-pay | Admitting: Cardiology

## 2019-03-24 IMAGING — DX DG CHEST 2V
2 series · 2 of 2 positions shown · non-contrast
Comparison: None.

CLINICAL DATA: Chest pain

EXAM:
CHEST  2 VIEW

[w chest pa]
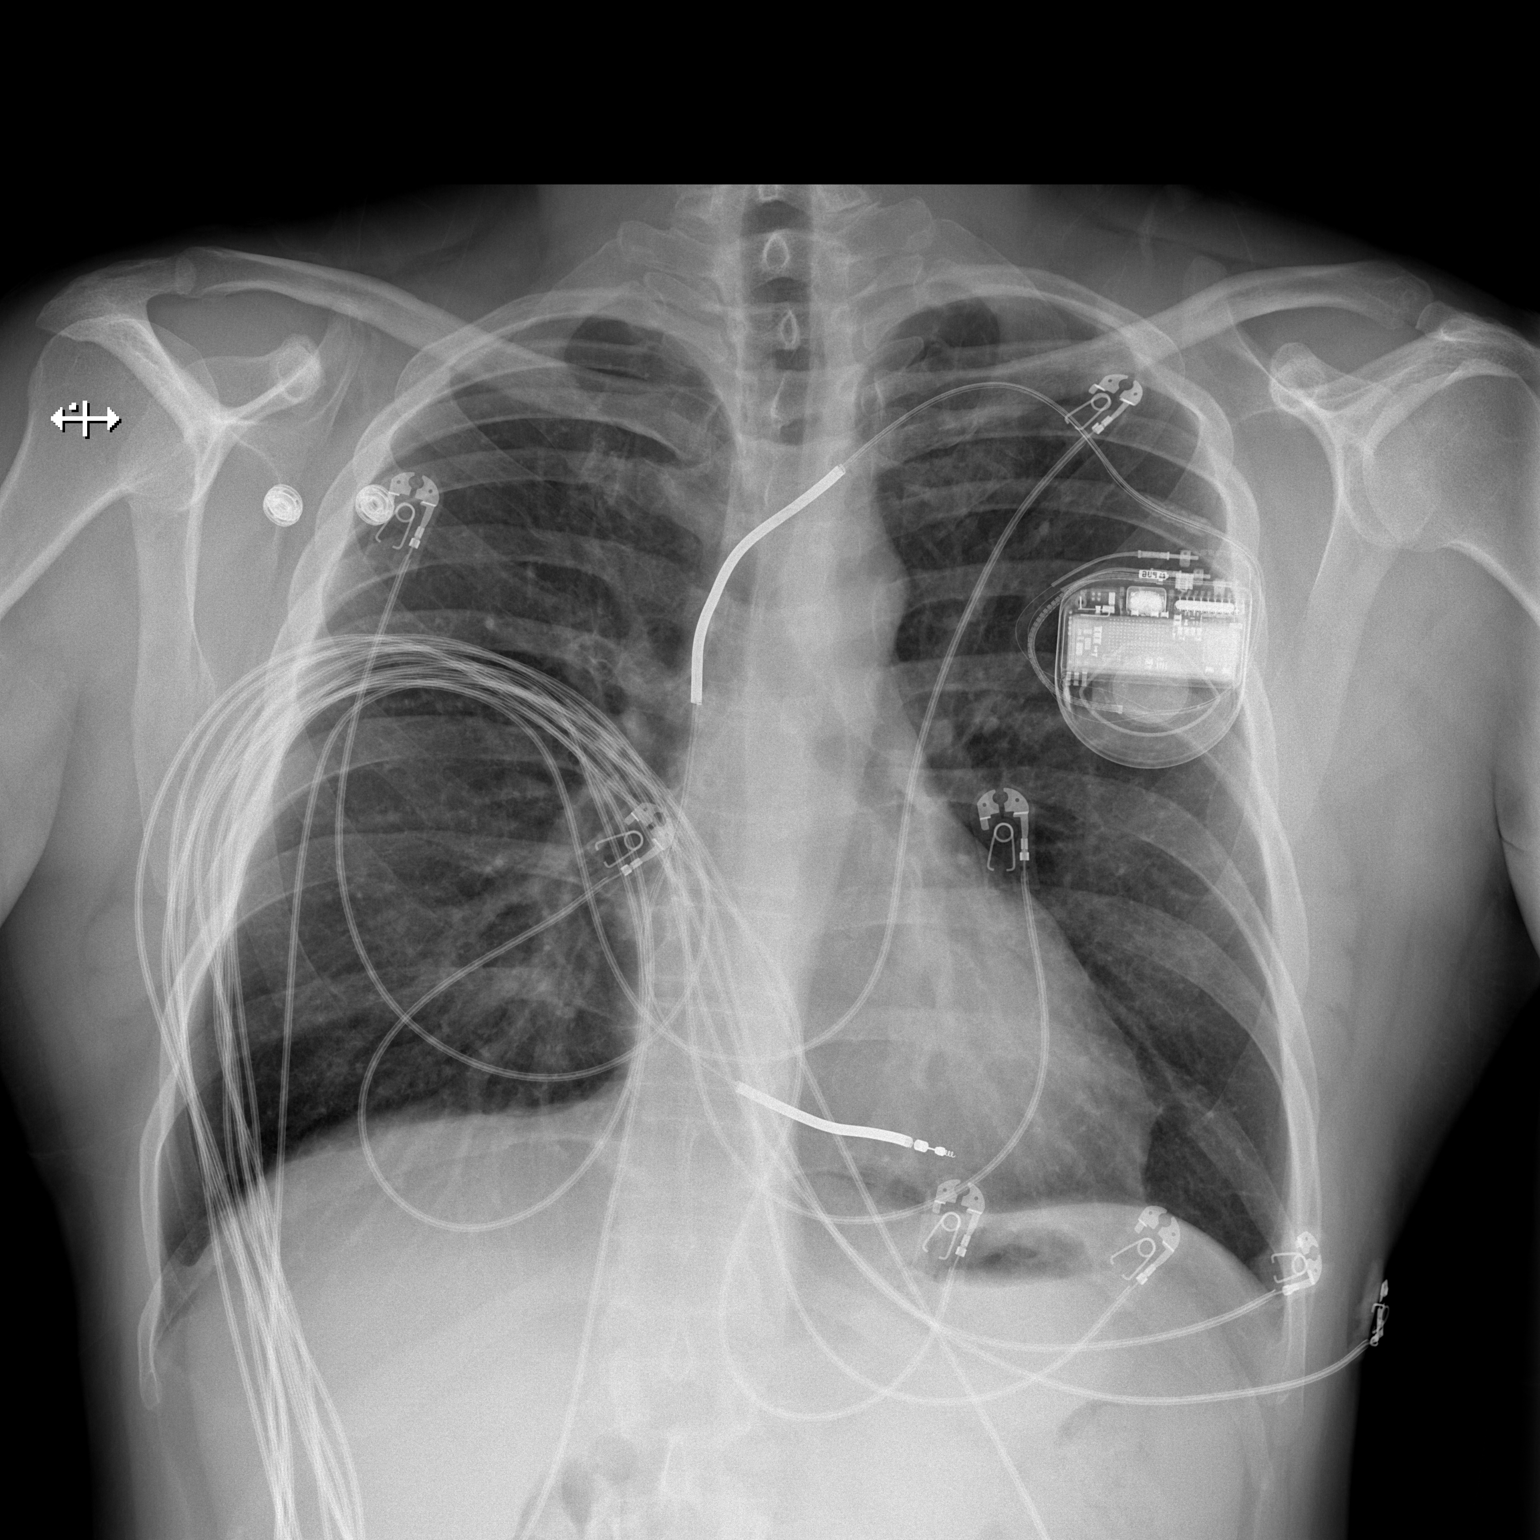

[w chest lat]
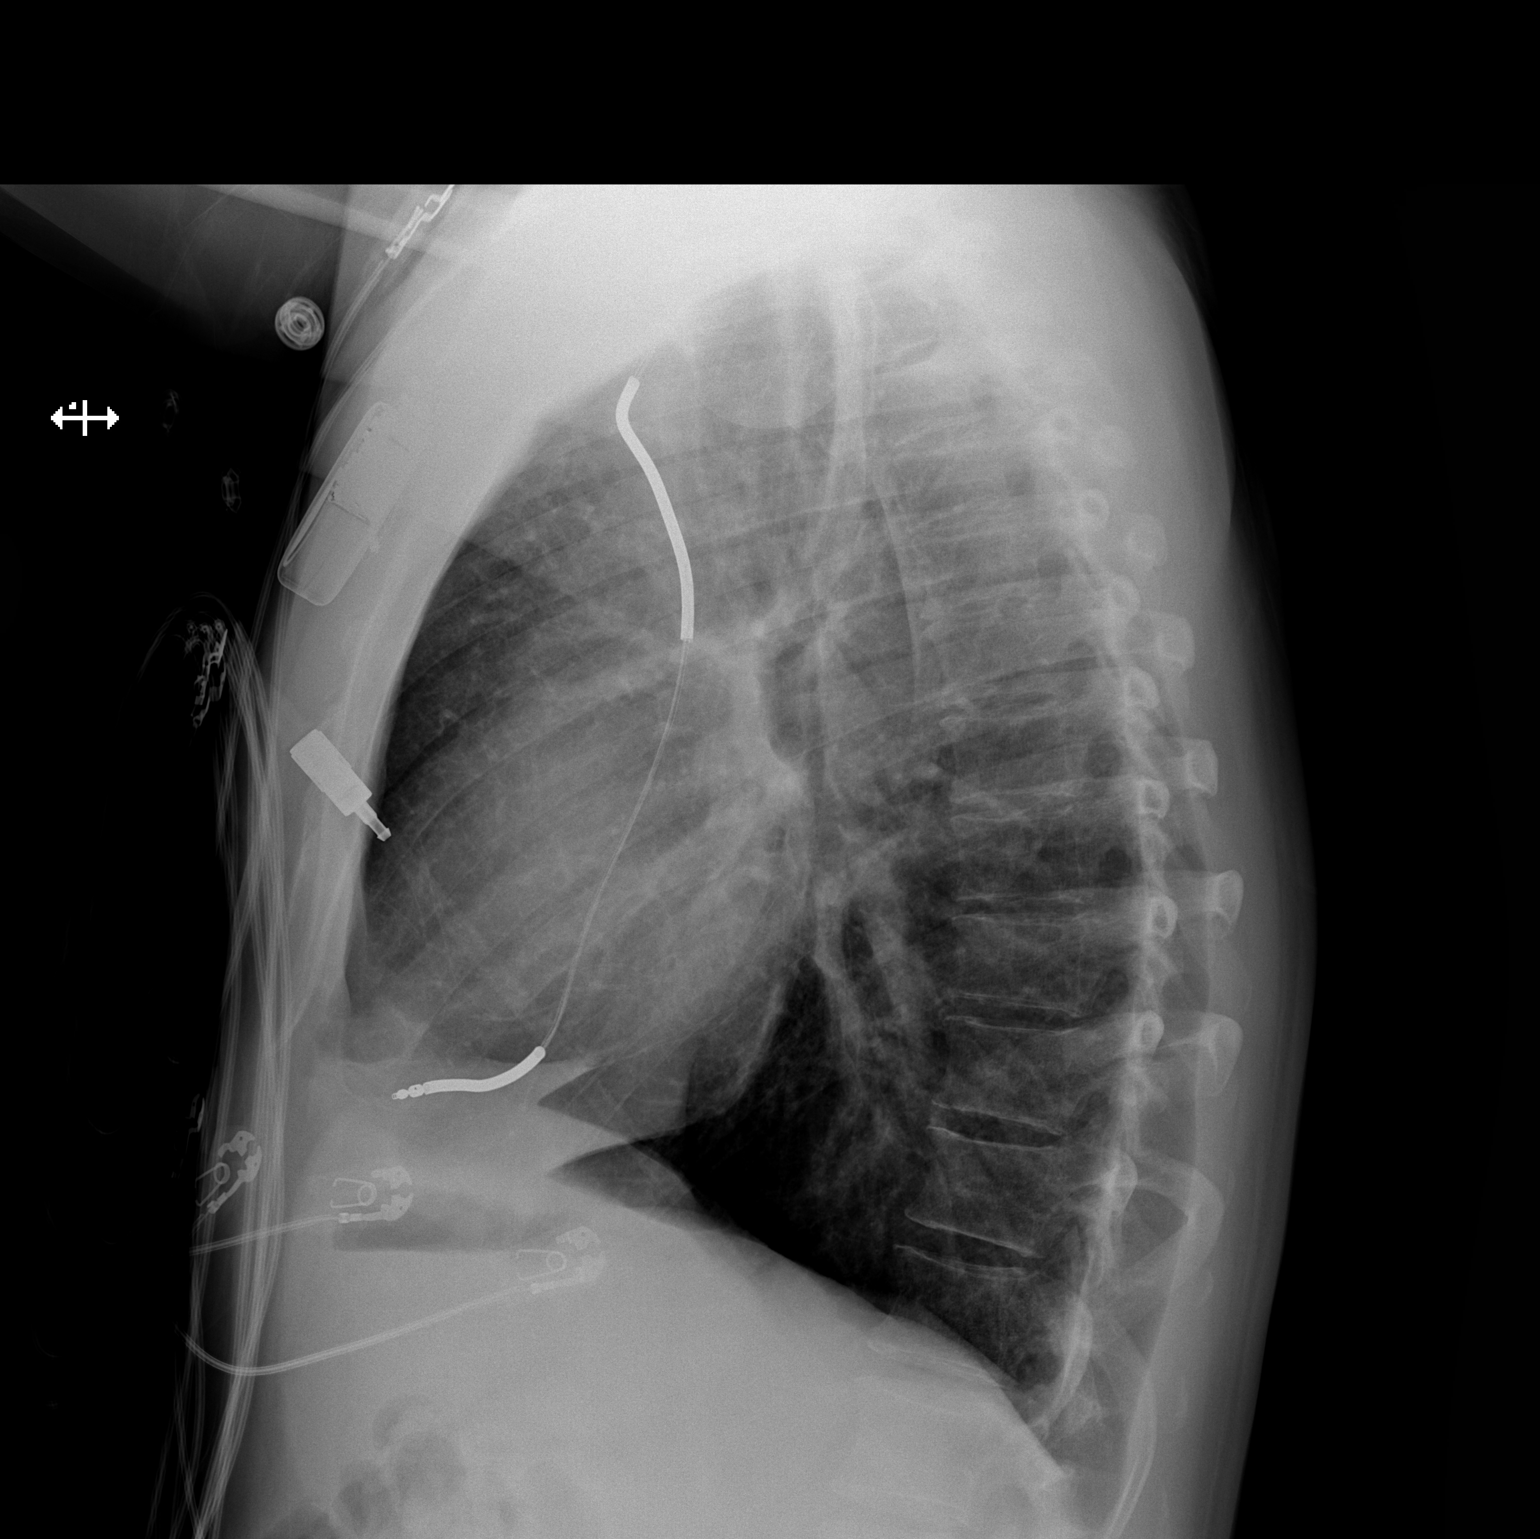

[2 of 2 positions shown; findings below may reference images not displayed]

FINDINGS: There is no focal parenchymal opacity. There is no pleural effusion
or pneumothorax. The heart and mediastinal contours are
unremarkable. There is a single lead cardiac pacemaker.

The osseous structures are unremarkable.
IMPRESSION: No active cardiopulmonary disease.

## 2019-08-28 ENCOUNTER — Ambulatory Visit: Payer: MEDICARE | Attending: Cardiovascular Disease

## 2019-09-10 ENCOUNTER — Ambulatory Visit: Payer: BLUE CROSS/BLUE SHIELD | Attending: Cardiovascular Disease

## 2019-09-26 ENCOUNTER — Ambulatory Visit: Payer: BLUE CROSS/BLUE SHIELD

## 2019-09-26 DIAGNOSIS — Z95810 Presence of automatic (implantable) cardiac defibrillator: Secondary | ICD-10-CM

## 2019-09-27 ENCOUNTER — Telehealth: Payer: BLUE CROSS/BLUE SHIELD

## 2019-09-27 NOTE — Telephone Encounter
Left voice mail for patient, referred patient to Cardiac Arrhythmia/EP Group [phone] for consult visit - MRI clearance.  Patient has a Medtronic ICD.  Patient advised to complete chest x-ray prior to EP visit.

## 2019-09-27 NOTE — Telephone Encounter
Left VM to schedule MRI clearance, ICD check.    [facility]- Please forward call to back office.

## 2019-10-02 ENCOUNTER — Telehealth: Payer: BLUE CROSS/BLUE SHIELD

## 2019-10-02 NOTE — Telephone Encounter
Spoke with patient to schedule MRI Clearance.  6/25 @ 9:30 am device check to follow with Dr. Delrae Rend 10 am.  Patient is aware needs to go to Radiology at  9 am to get chest x-ray prior to appointments.

## 2019-10-25 NOTE — Consults
OUTPATIENT CARDIAC ELECTROPHYSIOLOGY CONSULTATION  [facility]    DATE OF SERVICE: 10/26/2019    PATIENT: Christian Russo  MRN: [id number]  DOB: 1965-12-22    REFERRING PRACTITIONER: No ref. provider found  PRIMARY CARE PROVIDER: Pcp, No, MD    REASON FOR VISIT:   Chief Complaint   Patient presents with   ??? New Consult     MRI clearance       HISTORY OF PRESENT ILLNESS:  Jermari Tamargo is a 54 y.o. male with the following problems:  1. Single chamber ICD (Medtronic Visia AF MRI VR)  2. Cardiomyopathy  3. Frequent PVCs  ??? S/p PVC RFA 2013  4. PAF  5. HLP    Date of initial consultation: 10/26/2019    Mr. Faries is referred to the EP clinic for cardiac MRI clearance.    Mr. Cheatwood has single chamber ICD implanted in 2009 for primary prevention and a generator change in 2018 since then. The patient has history of 1 inappropriate shock for AF RVR several years ago. He has frequent PVCs with history of PVC ablation in 2013 but still frequent PVCs. Mr. Trulson is referred for EP clearance in the setting of ICD prior to cardiac MRI.     Mr. Kaus denies chest pain, chest pressure/discomfort, claudication, dyspnea, exertional chest pressure/discomfort, fatigue, irregular heart beat, lower extremity edema, near-syncope, orthopnea, palpitations, paroxysmal nocturnal dyspnea, syncope and tachypnea.    Mr. Dais presents for today's visit by himself.    EPS/Catheter Ablation     S/p PVC RFA 2013    Implantable Cardiac Device  Single chamber ICD (Medtronic Visia AF MRI VR)  DOI:  RV lead 06/01/2007  Pulse generator 05/2007; 12/02/2016  Initial implantation indications: primary prevention    PAST MEDICAL HISTORY:  Past Medical History:   Diagnosis Date   ??? Heart failure (HCC/RAF)    ??? Tobacco use disorder 09/20/2011       PAST SURGICAL HISTORY:  Past Surgical History:   Procedure Laterality Date   ??? ABLATION OF DYSRHYTHMIC FOCUS     ??? HAND SURGERY Right        SOCIAL HISTORY:  Social History     Socioeconomic History   ??? Marital status: Single     Spouse name: Not on file   ??? Number of children: 0   ??? Years of education: Not on file   ??? Highest education level: Not on file   Occupational History   ??? Occupation: Journalist   Social Needs   ??? Financial resource strain: Not on file   ??? Food insecurity     Worry: Not on file     Inability: Not on file   ??? Transportation needs     Medical: Not on file     Non-medical: Not on file   Tobacco Use   ??? Smoking status: Former Smoker     Types: Cigarettes     Quit date: 07/02/2011     Years since quitting: 8.3   ??? Smokeless tobacco: Never Used   Substance and Sexual Activity   ??? Alcohol use: No     Alcohol/week: 0.0 oz   ??? Drug use: No   ??? Sexual activity: Not Currently     Partners: Female     Birth control/protection: Condom   Lifestyle   ??? Physical activity     Days per week: Not on file     Minutes per session: Not on file   ??? Stress: Not on  file   Relationships   ??? Social Wellsite geologist on phone: Not on file     Gets together: Not on file     Attends religious service: Not on file     Active member of club or organization: Not on file     Attends meetings of clubs or organizations: Not on file     Relationship status: Not on file   Other Topics Concern   ??? Not on file   Social History Narrative   ??? Not on file       FAMILY HISTORY:  Family History   Problem Relation Age of Onset   ??? Heart failure Mother    ??? Cardiomyopathy Maternal Uncle    ??? Heart attack Maternal Grandfather 12        died from   ??? Lung cancer Paternal Grandmother         smoker   ??? Polio Paternal Grandfather        OUTPATIENT MEDICATIONS:  Outpatient Medications Prior to Visit   Medication Sig Dispense Refill   ??? atorvastatin (LIPITOR) 40 mg tablet      ??? escitalopram 20 mg tablet Take 1 tablet (20 mg total) by mouth daily. 90 tablet 0   ??? METOPROLOL SUCCINATE 50 mg 24 hr tablet TAKE 1 TABLET (50 MG TOTAL) BY MOUTH DAILY. 90 tablet 1   ??? ramipril (ALTACE) 2.5 mg capsule      ??? tadalafil (CIALIS) 20 mg tablet TAKE 1 TABLET BY MOUTH EVERY DAY AS NEEDED. 6 tablet 11   ??? zolpidem 10 mg tablet TAKE 1 TABLET BY MOUTH AT BEDTIME AS NEEDED. 90 tablet 1     No facility-administered medications prior to visit.        ALLERGIES:  Allergies   Allergen Reactions   ??? Oxycodone-Acetaminophen        REVIEW OF SYSTEMS:  Full 14 system review performed; all systems are negative, except as documented in the HPI above.    PHYSICAL EXAMINATION:  Vitals:    10/26/19 1021   BP: 95/54   Pulse: (!) 46   Resp: 18   Temp: 36.7 ???C (98.1 ???F)   SpO2: 98%   Weight: 160 lb (72.6 kg)   Height: 5' 6'' (1.676 m)     Body mass index is 25.82 kg/m???.    General: Well developed and well nourished male, in no acute distress.  EYES: Normal conjunctivae, both eyes. Extraocular movements intact.  EARS, NOSE, MOUTH AND THROAT: Oropharynx clear, moist mucous membranes, no cyanosis or pallor.  NECK: No jugular venous distention. Normal jugular venous pressure.  Neck supple. Trachea at midline.  CHEST: Left pectoral permanent implantable device noted. The scar is well healed. No evidence of erythema, edema, dehiscense, or discharge.  CARDIOVASCULAR  CARDIAC EXAM:  Palpation: Point of maximal impulse is in the fifth intercostal space on mid-clavicular line, non-displaced, normal in quality. No right ventricular lift. No palpable S3 or S4. No thrills.  Auscultation: Regular rate and rhythm. S1, S2 normal. No S3, S4. No murmurs. No rubs or gallops.  VASCULAR EXAM:  Carotid arteries: No carotid bruits bilaterally.  Radial pulses: Normal bilaterally.    Pedal pulses: Normal tibialis posterior and dorsalis pedis pulses bilaterally.  Edema: No lower extremity edema bilaterally.   No varices on lower extremities bilaterally.  RESPIRATORY: No use of accessory respiratory muscles, no intercostal retractions. Lungs clear to auscultation bilaterally.   GASTROINTESTINAL: No visible abdominal masses. Abdomen is  soft, non-tender, non-distended. No hepatosplenomegaly. Normoactive bowel sounds. MUSCULOSKELETAL: No visible kyphosis or scoliosis. Ambulates without difficulty. No muscular atrophies, tremor, spasticity. No joint swelling.  EXTREMITIES: No clubbing or cyanosis.  SKIN: Warm, dry, well perfused, non-icteric. No evidence of stasis dermatitis, ulcers, or scars on lower extremities bilaterally.   NEUROLOGIC: Alert and oriented to time, place, and person. No focal neurological deficits.  PSYCHIATRIC: Mood and affect appear appropriate. Cooperative. Speech normal in context and clarity. Memory grossly intact.     LABORATORY DATA:  Lab Results   Component Value Date    WBC 6.25 03/12/2015    HGB 15.3 03/12/2015    HCT 46.3 03/12/2015    MCV 90.1 03/12/2015    PLT 277 03/12/2015     Lab Results   Component Value Date    CREAT 0.8 03/12/2015    BUN 10 03/12/2015    NA 145 03/12/2015    K 4.8 03/12/2015    CL 100 03/12/2015    CO2 28 03/12/2015     No results found for: MG  No results found for: ICALCOR  Lab Results   Component Value Date    GLUCOSE 84 03/12/2015     Lab Results   Component Value Date    HGBA1C 5.6 03/12/2015     Lab Results   Component Value Date    ALT 35 03/12/2015    AST 32 03/12/2015    ALKPHOS 57 03/12/2015    BILITOT 0.5 03/12/2015     No results found for: INR, PT, APTT  Lab Results   Component Value Date    CHOL 338 03/12/2015    CHOLHDL 53 03/12/2015    CHOLDLCAL  03/12/2015      Comment:      Test not performed.  Cholesterol, LDL, Calculated is not accurate when triglyceride value is >400.      TRIGLY 657 (H) 03/12/2015     No results found for: TSH, T3AUTO, T4AUTO  No results found for: BNP  No components found for: URINALYSIS      CARDIAC DATA:  ECG   ECGs below have been personally reviewed and interpreted by me.  ECG N/A    Echocardiogram  TTE 02/09/2012: LVEF 38%.    Stress test  N/A    Ambulatory cardiac monitoring    All ambulatory cardiac monitoring below has been been reviewed and interpreted by me.  N/A    Implantable cardiac device interrogation  ICD interrogation performed 10/26/2019 has been pesronally reviewed and interpreted by me. Please see separate procedure note.  Single chamber ICD (Medtronic Visia AF MRI VR)  DOI:  RV lead 06/01/2007  Pulse generator 05/2007; 12/02/2016  Initial implantation indications: primary prevention  Underlying rhythm: SR 67 with PVCs.  VP 0.  3 NSVT episodes.  PAF, 2.5% - some of it due to frequent PVCs  Conclusions:  Normal single chamber ICD function  Not pacemaker-dependent  Battery not a ERI    Advanced Cardiac Imaging  N/A    Cardiac Catheterization     N/A    DIAGNOSTIC/IMAGING STUDIES:  PA and lateral CXR 10/26/2019 has been reviewed and interpreted by me.  Left infraclavicular single chamber ICD with dual coil RV lead.   No abandoned or epicardial leads.    IMPRESSION:  54 y.o. male with:  1. Single chamber ICD (Medtronic Visia AF MRI VR)  2. Cardiomyopathy  3. Frequent PVCs  ??? S/p PVC RFA 2013  4. PAF  5. HLP  DISCUSSION:  Getting a cardiac MRI in the setting of implanted single chamber ICD was discussed in detail with [name]. It was explained to the patient that this is not a standard procedure but has been carried out safely at [facility] on a case by case basis when the required clinical information from MRI exceeds the risk associated with the scan. Potential risks include but are not limited to, arrhythmias, heart injury, heart attack, stroke, pacemaker malfunction even requiring pacemaker replacement. Mr. Meinecke was given literature about MRI in the setting of non-MRI compatible cardiac implantable devices. The patient fully understood the procedure, risks, benefits and alternatives and wished to proceed with the scan as requested by his referring physician. All questions were answered.    RECOMMENDATIONS/PLAN:  OK to proceed with cardiac MRI at [facility]. The patient is not pacemaker-dependent. He has no abandoned or epicardial leads.     I discussed assessment and plan with Mr. Hassey at length. All questions were answered.        Sherald Barge, MD  Assistant Clinical Professor of Medicine  Cardiac Electrophysiology  [facility]    Author: Sherald Barge, MD 10/26/2019 1:49 PM

## 2019-10-26 ENCOUNTER — Ambulatory Visit: Payer: BLUE CROSS/BLUE SHIELD | Attending: Cardiovascular Disease

## 2019-10-26 ENCOUNTER — Ambulatory Visit: Payer: PRIVATE HEALTH INSURANCE

## 2019-10-26 ENCOUNTER — Inpatient Hospital Stay: Payer: PRIVATE HEALTH INSURANCE | Attending: Family

## 2019-10-26 DIAGNOSIS — E785 Hyperlipidemia, unspecified: Secondary | ICD-10-CM

## 2019-10-26 DIAGNOSIS — I429 Cardiomyopathy, unspecified: Secondary | ICD-10-CM

## 2019-10-26 DIAGNOSIS — Z95810 Presence of automatic (implantable) cardiac defibrillator: Secondary | ICD-10-CM

## 2019-10-26 DIAGNOSIS — I493 Ventricular premature depolarization: Secondary | ICD-10-CM

## 2019-10-26 DIAGNOSIS — I480 Paroxysmal atrial fibrillation: Secondary | ICD-10-CM

## 2019-10-26 NOTE — Addendum Note
Addended by: Uvaldo Bristle on: 10/26/2019 03:08 PM     Modules accepted: Orders

## 2019-10-26 NOTE — Progress Notes
SEE MEDIA SECTION FOR ACTUAL PRINTOUT    DATE OF SERVICE: 10/26/2019  ICD: Medtronic   Model: Visia AF MRI VR [id number]  Serial # [id number]  Implant Date: 12/02/2016  Lead: [id number] [employer] Quattro Secure    Remaining Longevity: 9.3 years    Thresholds:   Underlying rhythm: Sinus rhythm at 67 bpm with frequent PVCs.  R wave: 3.8 mV.   RV lead impedance: 456 ohms.   RV/SVC impedance: 47/65 ohms.   RV capture voltage: 3.00 V at 1.0 ms.     Since 06/19/2019:  Afib/AFL/SVT episodes: None.  VT/Vfib episodes: None.  VP = <0.1%.    Settings:   VVI 40 ppm.     Changes made this session: None, temporarily reprogrammed device for testing purpose only.    Impression:   1. Normal ICD function.     Plan:   1. Patient to see Dr. Delrae Rend with printout for MRI clearance. Please see separate EP note.

## 2019-11-13 ENCOUNTER — Ambulatory Visit: Payer: PRIVATE HEALTH INSURANCE | Attending: Cardiovascular Disease

## 2019-11-20 ENCOUNTER — Ambulatory Visit: Payer: MEDICARE | Attending: Cardiovascular Disease

## 2019-11-20 ENCOUNTER — Ambulatory Visit: Payer: Medicaid HMO

## 2020-01-27 ENCOUNTER — Other Ambulatory Visit: Payer: Self-pay

## 2020-01-27 ENCOUNTER — Inpatient Hospital Stay (HOSPITAL_COMMUNITY)
Admission: EM | Admit: 2020-01-27 | Discharge: 2020-01-30 | DRG: 308 | Disposition: A | Discharge status: Home / Self Care | Payer: Medicare Other | Attending: Internal Medicine | Admitting: Internal Medicine

## 2020-01-27 ENCOUNTER — Encounter (HOSPITAL_COMMUNITY): Payer: Self-pay

## 2020-01-27 ENCOUNTER — Emergency Department (HOSPITAL_COMMUNITY): Payer: Medicare Other

## 2020-01-27 DIAGNOSIS — I5043 Acute on chronic combined systolic (congestive) and diastolic (congestive) heart failure: Secondary | ICD-10-CM | POA: Diagnosis present

## 2020-01-27 DIAGNOSIS — R002 Palpitations: Secondary | ICD-10-CM

## 2020-01-27 DIAGNOSIS — I509 Heart failure, unspecified: Secondary | ICD-10-CM

## 2020-01-27 DIAGNOSIS — F329 Major depressive disorder, single episode, unspecified: Secondary | ICD-10-CM | POA: Diagnosis present

## 2020-01-27 DIAGNOSIS — I4891 Unspecified atrial fibrillation: Secondary | ICD-10-CM | POA: Diagnosis present

## 2020-01-27 DIAGNOSIS — Z23 Encounter for immunization: Secondary | ICD-10-CM

## 2020-01-27 DIAGNOSIS — F1010 Alcohol abuse, uncomplicated: Secondary | ICD-10-CM | POA: Diagnosis present

## 2020-01-27 DIAGNOSIS — Z8249 Family history of ischemic heart disease and other diseases of the circulatory system: Secondary | ICD-10-CM

## 2020-01-27 DIAGNOSIS — F17210 Nicotine dependence, cigarettes, uncomplicated: Secondary | ICD-10-CM | POA: Diagnosis present

## 2020-01-27 DIAGNOSIS — F411 Generalized anxiety disorder: Secondary | ICD-10-CM | POA: Diagnosis present

## 2020-01-27 DIAGNOSIS — I472 Ventricular tachycardia: Secondary | ICD-10-CM | POA: Diagnosis not present

## 2020-01-27 DIAGNOSIS — Z7901 Long term (current) use of anticoagulants: Secondary | ICD-10-CM

## 2020-01-27 DIAGNOSIS — I480 Paroxysmal atrial fibrillation: Secondary | ICD-10-CM | POA: Diagnosis present

## 2020-01-27 DIAGNOSIS — I471 Supraventricular tachycardia: Secondary | ICD-10-CM | POA: Diagnosis present

## 2020-01-27 DIAGNOSIS — I083 Combined rheumatic disorders of mitral, aortic and tricuspid valves: Secondary | ICD-10-CM | POA: Diagnosis present

## 2020-01-27 DIAGNOSIS — Z79899 Other long term (current) drug therapy: Secondary | ICD-10-CM

## 2020-01-27 DIAGNOSIS — Z885 Allergy status to narcotic agent status: Secondary | ICD-10-CM

## 2020-01-27 DIAGNOSIS — I428 Other cardiomyopathies: Secondary | ICD-10-CM | POA: Diagnosis present

## 2020-01-27 DIAGNOSIS — I493 Ventricular premature depolarization: Secondary | ICD-10-CM | POA: Diagnosis present

## 2020-01-27 DIAGNOSIS — I5023 Acute on chronic systolic (congestive) heart failure: Secondary | ICD-10-CM

## 2020-01-27 DIAGNOSIS — R778 Other specified abnormalities of plasma proteins: Secondary | ICD-10-CM | POA: Diagnosis present

## 2020-01-27 DIAGNOSIS — E785 Hyperlipidemia, unspecified: Secondary | ICD-10-CM | POA: Diagnosis present

## 2020-01-27 DIAGNOSIS — Z95810 Presence of automatic (implantable) cardiac defibrillator: Secondary | ICD-10-CM

## 2020-01-27 DIAGNOSIS — Z20822 Contact with and (suspected) exposure to covid-19: Secondary | ICD-10-CM | POA: Diagnosis present

## 2020-01-27 LAB — BASIC METABOLIC PANEL
Anion gap: 12 (ref 5–15)
BUN: 20 mg/dL (ref 6–20)
CO2: 23 mmol/L (ref 22–32)
Calcium: 9.7 mg/dL (ref 8.9–10.3)
Chloride: 101 mmol/L (ref 98–111)
Creatinine, Ser: 0.84 mg/dL (ref 0.61–1.24)
GFR calc Af Amer: >60 mL/min (ref >60)
GFR calc non Af Amer: >60 mL/min (ref >60)
Glucose, Bld: 108 mg/dL — ABNORMAL HIGH (ref 70–99)
Potassium: 4.6 mmol/L (ref 3.5–5.1)
Sodium: 136 mmol/L (ref 135–145)

## 2020-01-27 LAB — RESPIRATORY PANEL BY RT PCR (FLU A&B, COVID)
Influenza A by PCR: NEGATIVE
Influenza B by PCR: NEGATIVE
SARS Coronavirus 2 by RT PCR: NEGATIVE

## 2020-01-27 LAB — CBC
HCT: 47.2 % (ref 39.0–52.0)
Hemoglobin: 15.4 g/dL (ref 13.0–17.0)
MCH: 30.5 pg (ref 26.0–34.0)
MCHC: 32.6 g/dL (ref 30.0–36.0)
MCV: 93.5 fL (ref 80.0–100.0)
Platelets: 337 10*3/uL (ref 150–400)
RBC: 5.05 MIL/uL (ref 4.22–5.81)
RDW: 13.7 % (ref 11.5–15.5)
WBC: 11 10*3/uL — ABNORMAL HIGH (ref 4.0–10.5)
nRBC: 0 % (ref 0.0–0.2)

## 2020-01-27 LAB — BRAIN NATRIURETIC PEPTIDE: B Natriuretic Peptide: 991.7 pg/mL — ABNORMAL HIGH (ref 0.0–100.0)

## 2020-01-27 LAB — TROPONIN I (HIGH SENSITIVITY)
Troponin I (High Sensitivity): 61 ng/L — ABNORMAL HIGH (ref <18)
Troponin I (High Sensitivity): 73 ng/L — ABNORMAL HIGH (ref <18)

## 2020-01-27 LAB — DIGOXIN LEVEL: Digoxin Level: 2.3 ng/mL (ref 1.0–2.0)

## 2020-01-27 MED ORDER — METOPROLOL SUCCINATE ER 25 MG PO TB24
25.0000 mg | ORAL_TABLET | Freq: Every day | ORAL | Status: DC
  Administered 2020-01-28: 25 mg via ORAL
  Filled 2020-01-27 (×2): qty 1

## 2020-01-27 MED ORDER — LORAZEPAM 1 MG PO TABS
1.0000 mg | ORAL_TABLET | Freq: Once | ORAL | Status: AC
  Administered 2020-01-27: 1 mg via ORAL
  Filled 2020-01-27: qty 1

## 2020-01-27 MED ORDER — ATORVASTATIN CALCIUM 40 MG PO TABS
40.0000 mg | ORAL_TABLET | Freq: Every day | ORAL | Status: DC
  Administered 2020-01-28 – 2020-01-30 (×3): 40 mg via ORAL
  Filled 2020-01-27 (×4): qty 1

## 2020-01-27 MED ORDER — ONDANSETRON HCL 4 MG/2ML IJ SOLN: 4.0000 mg | Freq: Four times a day (QID) | INTRAMUSCULAR | Status: DC | PRN

## 2020-01-27 MED ORDER — ZOLPIDEM TARTRATE 5 MG PO TABS
10.0000 mg | ORAL_TABLET | Freq: Every evening | ORAL | Status: DC | PRN
  Administered 2020-01-28 – 2020-01-30 (×3): 10 mg via ORAL
  Filled 2020-01-27 (×3): qty 2

## 2020-01-27 MED ORDER — ESCITALOPRAM OXALATE 20 MG PO TABS
20.0000 mg | ORAL_TABLET | Freq: Every day | ORAL | Status: DC
  Administered 2020-01-28 – 2020-01-30 (×3): 20 mg via ORAL
  Filled 2020-01-27 (×3): qty 1
  Filled 2020-01-27: qty 2

## 2020-01-27 MED ORDER — APIXABAN 5 MG PO TABS
5.0000 mg | ORAL_TABLET | Freq: Two times a day (BID) | ORAL | Status: DC
  Administered 2020-01-27 – 2020-01-30 (×6): 5 mg via ORAL
  Filled 2020-01-27 (×6): qty 1

## 2020-01-27 MED ORDER — DIGOXIN 125 MCG PO TABS
250.0000 ug | ORAL_TABLET | Freq: Every day | ORAL | Status: DC
  Administered 2020-01-28: 250 ug via ORAL
  Filled 2020-01-27 (×2): qty 2

## 2020-01-27 NOTE — ED Notes (Signed)
EDP made aware of elevated Digoxin level

## 2020-01-27 NOTE — ED Provider Notes (Signed)
[facility] EMERGENCY DEPARTMENT Provider Note   CSN: [id number] Arrival date & time: 01/27/20  1517     History No chief complaint on file.   Dennis Mason is a 54 y.o. male.  HPI     2 days of palpitations Called EP in [place] regarding heart being out of rhythm Took 2 digoxin and 2 metoprolol Took the first one around 930 and second around 1230 Takes prn lasix for fluid overload, 2 nights ago had some orthopnea began taking it, however did not take it this AM as he thought he would get it in hospital   Heart beating fast, all over the place, out of rhythm.  Dyspnea. Has felt like this once before. No leg pain or swelling.    No chest pain, no nausea or vomiting, no swelling    Past Medical History:  Diagnosis Date  . Cardiomyopathy (HCC)   . CHF (congestive heart failure) (HCC)   . Depression     There are no problems to display for this patient.   Past Surgical History:  Procedure Laterality Date  . CARDIAC DEFIBRILLATOR PLACEMENT     also pacemaker  . HAND SURGERY Right    reconstruction  . ICD GENERATOR CHANGEOUT N/A 12/02/2016   Procedure: ICD Generator Changeout;  Surgeon: Regan Lemming, MD;  Location: [facility] INVASIVE CV LAB;  Service: Cardiovascular;  Laterality: N/A;       Family History  Problem Relation Age of Onset  . Heart Problems Mother     Social History   Tobacco Use  . Smoking status: Never Smoker  . Smokeless tobacco: Never Used  Substance Use Topics  . Alcohol use: No  . Drug use: No    Home Medications Prior to Admission medications   Medication Sig Start Date End Date Taking? Authorizing Provider  apixaban (ELIQUIS) 5 MG TABS tablet Take 5 mg by mouth in the morning and at bedtime. 10/22/19  Yes [provider]  atorvastatin (LIPITOR) 40 MG tablet Take 40 mg by mouth daily.   Yes [provider]  digoxin (LANOXIN) 0.25 MG tablet Take 250 mcg by mouth daily. 10/24/19  Yes [provider]  escitalopram (LEXAPRO) 20 MG tablet Take 20 mg by mouth daily. 10/06/15  Yes [provider]  furosemide (LASIX) 40 MG tablet Take 40 mg by mouth daily as needed. 11/20/19  Yes [provider]  KLOR-CON M10 10 MEQ tablet Take 10 mEq by mouth daily as needed. 11/20/19  Yes [provider]  metoprolol succinate (TOPROL-XL) 25 MG 24 hr tablet Take 25 mg by mouth daily. 11/20/19  Yes [provider]  zolpidem (AMBIEN) 10 MG tablet Take 10 mg by mouth at bedtime as needed for sleep.   Yes [provider]  sacubitril-valsartan (ENTRESTO) 24-26 MG Take 1 tablet by mouth 2 (two) times daily. 11/01/19   [provider]  tadalafil (CIALIS) 20 MG tablet Take 1 tablet by mouth daily as needed. 04/13/18   [provider]    Allergies    Percocet [oxycodone-acetaminophen]  Review of Systems   Review of Systems  Constitutional: Negative for fever.  HENT: Negative for sore throat.   Eyes: Negative for visual disturbance.  Respiratory: Positive for shortness of breath.   Cardiovascular: Positive for palpitations. Negative for chest pain and leg swelling.  Gastrointestinal: Negative for abdominal pain, nausea and vomiting.  Genitourinary: Negative for difficulty urinating.  Musculoskeletal: Negative for back pain and neck stiffness.  Skin: Negative for rash.  Neurological: Negative for syncope and headaches.    Physical Exam Updated Vital Signs BP 108/75   Pulse (!) 108   Temp 98.4 F (36.9 C) (Oral)   Resp (!) 27   SpO2 94%   Physical Exam Vitals and nursing note reviewed.  Constitutional:      General: He is not in acute distress.    Appearance: He is well-developed. He is not diaphoretic.  HENT:     Head: Normocephalic and atraumatic.  Eyes:     Conjunctiva/sclera: Conjunctivae normal.  Cardiovascular:     Rate and Rhythm: Tachycardia present. Rhythm irregular.     Heart sounds: Normal heart sounds. No murmur heard.  No  friction rub. No gallop.   Pulmonary:     Effort: Pulmonary effort is normal. No respiratory distress.     Breath sounds: Normal breath sounds. No wheezing or rales.  Abdominal:     General: There is no distension.     Palpations: Abdomen is soft.     Tenderness: There is no abdominal tenderness. There is no guarding.  Musculoskeletal:     Cervical back: Normal range of motion.  Skin:    General: Skin is warm and dry.  Neurological:     Mental Status: He is alert and oriented to person, place, and time.     ED Results / Procedures / Treatments   Labs (all labs ordered are listed, but only abnormal results are displayed) Labs Reviewed  BASIC METABOLIC PANEL - Abnormal; Notable for the following components:      Result Value   Glucose, Bld 108 (*)    All other components within normal limits  CBC - Abnormal; Notable for the following components:   WBC 11.0 (*)    All other components within normal limits  DIGOXIN LEVEL - Abnormal; Notable for the following components:   Digoxin Level 2.3 (*)    All other components within normal limits  BRAIN NATRIURETIC PEPTIDE - Abnormal; Notable for the following components:   B Natriuretic Peptide 991.7 (*)    All other components within normal limits  TROPONIN I (HIGH SENSITIVITY) - Abnormal; Notable for the following components:   Troponin I (High Sensitivity) 61 (*)    All other components within normal limits  TROPONIN I (HIGH SENSITIVITY) - Abnormal; Notable for the following components:   Troponin I (High Sensitivity) 73 (*)    All other components within normal limits    EKG EKG Interpretation  Date/Time:  Sunday January 27 2020 15:54:38 EDT Ventricular Rate:  115 PR Interval:  184 QRS Duration: 106 QT Interval:  314 QTC Calculation: 434 R Axis:   21 Text Interpretation: Sinus tachycardia ST depression lateral leads likely LVH with repolarization abnormality and rate realted Left ventricular hypertrophy with  repolarization abnormality ( Cornell product ) Abnormal ECG Confirmed by Alvira Monday ([id number]) on 01/27/2020 5:44:45 PM   Radiology DG Chest 2 View  Result Date: 01/27/2020 CLINICAL DATA:  Shortness of breath and chest pain EXAM: CHEST - 2 VIEW COMPARISON:  12/04/2016 FINDINGS: Cardiac shadow is at the upper limits of normal in size. The lungs are well aerated bilaterally. No focal infiltrate or sizable effusion is seen. Defibrillator is again noted and stable. IMPRESSION: No acute abnormality noted. Electronically Signed   By: Alcide Clever M.D.   On: 01/27/2020 16:43    Procedures Procedures (including critical care time)  Medications Ordered in ED Medications  LORazepam (ATIVAN) tablet 1 mg (has no  administration in time range)    ED Course  I have reviewed the triage vital signs and the nursing notes.  Pertinent labs & imaging results that were available during my care of the patient were reviewed by me and considered in my medical decision making (see chart for details).    MDM Rules/Calculators/A&P                          54yo male with history of single chamber ICD, cardiomyopathy 38%, PVCs, paroxysmal atrial fibrillation, hyperlipidemia seen by EP in [place] who presents with concern for palpitations.   EKG shows sinus tachycardia with frequent PVCs, and frequent PVCs also noted on the monitor.  Chest x-ray shows no acute abnormalities.  BNP is elevated to the 900s, with prior values at time of congestive heart failure exacerbation of 1100.  His digoxin level is 2.3.  No sign of renal failure, hyperkalemia, no GI symptoms or neurologic symptoms.  Do not feel his frequent PVCs, or amount of digoxin taken are consistent with digoxin toxicity.  Discussed with pharmacy.  He was having these symptoms prior to taking the digoxin.  Consulted cardiology given frequent PVCs with patient symptomatic, history of severe cardiomyopathy, elevated BNP with mild congestive heart failure  exacerbation.  Cardiology to admit for further care.   Final Clinical Impression(s) / ED Diagnoses Final diagnoses:  Palpitations  Frequent PVCs  Acute on chronic systolic congestive heart failure [facility])    Rx / DC Orders ED Discharge Orders    None       Alvira Monday, MD 01/27/20 2040

## 2020-01-27 NOTE — Consult Note (Addendum)
Cardiology Consultation:   Patient ID: BROEDY OSBOURNE MRN: [id number]; DOB: [birth year]  Admit date: 01/27/2020 Date of Consult: 01/27/2020  Primary Care Provider: Pcp, No Primary Cardiologist: No primary care provider on file.  Primary Electrophysiologist:  None    Patient Profile:   Dennis Mason is a 54 y.o. male with a hx of frequent PVC's (s/p ablation 2013), AF, HFrEF s/p ICD 2009 who is being seen today for the evaluation of fatigue, PVC's.  History of Present Illness:   Mr. Dennis Mason has a longstanding history of cardiomyopathy beginning over 20 years ago. He has been followed by a cardiologist and electrophysiologist in [place] where he lives. 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 He has a single lead primary prevention ICD in place and has only been shocked once. This was years ago and he was told this was an inappropriate shock.   The patient is from [place] and his mother recently died. He came to [facility] to help settle his mother's affairs after her death. He has been here for about 1.5 months and plans on being in this area for another 2 weeks.   The patient has not been feeling well over the past few days. He states his heart rates have been higher and he has felt palpitations and and generally felt unwell. The palpitations are "unbarable." He denies syncope but has had some presyncopal episodes. He has had no ICD shocks. He has felt as though he has been retaining fluid and started taking his PRN lasix a few days ago as well as potassium. His fluid retention improved but his symptoms of generally feeling unwell and palpitations did not improve. He contacted his EP in [place], who told him to take an extra dose of Digoxin, so he took 0.25mg  of digoxin twice today. He decided to come to the ED for evaluation.   In the ED, the patient's ECG revealed sinus  tachycardia with frequent PVC's and NSVT. His labs revealed a BNP of 991, troponin of 61 then 73, and a serum digoxin level of 2.3. The remainder of his labs were unremarkable. An interrogation of his ICD revealed frequent atrial fibrillation and 6 monitored episodes of VT, the longest of which being 7 minutes in duration. Cardiology was consulted.   The patient currently states he feels very anxious and he is struggling dealing with the palpitations. He was supposed to have a cardiac MRI at [facility] followed by another ablation, however his mother's passing resulted in delayed plans. He has not been taking his Sherryll Burger since coming to [place] as he forgot this.   Heart Pathway Score:     Past Medical History:  Diagnosis Date  . Cardiomyopathy (HCC)   . CHF (congestive heart failure) (HCC)   . Depression     Past Surgical History:  Procedure Laterality Date  . CARDIAC DEFIBRILLATOR PLACEMENT     also pacemaker  . HAND SURGERY Right    reconstruction  . ICD GENERATOR CHANGEOUT N/A 12/02/2016   Procedure: ICD Generator Changeout;  Surgeon: Regan Lemming, MD;  Location: [facility] INVASIVE CV LAB;  Service: Cardiovascular;  Laterality: N/A;     Home Medications:  Prior to Admission medications   Medication Sig Start Date End Date Taking? Authorizing Provider  apixaban (ELIQUIS) 5 MG TABS tablet Take 5 mg by mouth in the morning and at bedtime. 10/22/19  Yes [provider]  atorvastatin (LIPITOR) 40 MG tablet Take 40 mg by mouth daily.   Yes [provider]  digoxin (LANOXIN) 0.25 MG tablet Take 250 mcg by mouth daily. 10/24/19  Yes [provider]  escitalopram (LEXAPRO) 20 MG tablet Take 20 mg by mouth daily. 10/06/15  Yes [provider]  furosemide (LASIX) 40 MG tablet Take 40 mg by mouth daily as needed. 11/20/19  Yes [provider]  KLOR-CON M10 10 MEQ tablet Take 10 mEq by mouth daily as needed. 11/20/19  Yes [provider]  metoprolol  succinate (TOPROL-XL) 25 MG 24 hr tablet Take 25 mg by mouth daily. 11/20/19  Yes [provider]  zolpidem (AMBIEN) 10 MG tablet Take 10 mg by mouth at bedtime as needed for sleep.   Yes [provider]  sacubitril-valsartan (ENTRESTO) 24-26 MG Take 1 tablet by mouth 2 (two) times daily. 11/01/19   [provider]  tadalafil (CIALIS) 20 MG tablet Take 1 tablet by mouth daily as needed. 04/13/18   [provider]    Inpatient Medications: Scheduled Meds:  Continuous Infusions:  PRN Meds:   Allergies:    Allergies  Allergen Reactions  . Percocet [Oxycodone-Acetaminophen] Itching    Social History:   Social History   Socioeconomic History  . Marital status: Married    Spouse name: Not on file  . Number of children: Not on file  . Years of education: Not on file  . Highest education level: Not on file  Occupational History  . Not on file  Tobacco Use  . Smoking status: Never Smoker  . Smokeless tobacco: Never Used  Substance and Sexual Activity  . Alcohol use: No  . Drug use: No  . Sexual activity: Not on file  Other Topics Concern  . Not on file  Social History Narrative  . Not on file   Social Determinants of Health   Financial Resource Strain:   . Difficulty of Paying Living Expenses: Not on file  Food Insecurity:   . Worried About Programme researcher, broadcasting/film/video in the Last Year: Not on file  . Ran Out of Food in the Last Year: Not on file  Transportation Needs:   . Lack of Transportation (Medical): Not on file  . Lack of Transportation (Non-Medical): Not on file  Physical Activity:   . Days of Exercise per Week: Not on file  . Minutes of Exercise per Session: Not on file  Stress:   . Feeling of Stress : Not on file  Social Connections:   . Frequency of Communication with Friends and Family: Not on file  . Frequency of Social Gatherings with Friends and Family: Not on file  . Attends Religious Services: Not on file  . Active Member  of Clubs or Organizations: Not on file  . Attends Banker Meetings: Not on file  . Marital Status: Not on file  Intimate Partner Violence:   . Fear of Current or Ex-Partner: Not on file  . Emotionally Abused: Not on file  . Physically Abused: Not on file  . Sexually Abused: Not on file    Family History:   Family History  Problem Relation Age of Onset  . Heart Problems Mother      ROS:  Please see the history of present illness.  All other ROS reviewed and negative.     Physical Exam/Data:   Vitals:   01/27/20 1633 01/27/20 1815 01/27/20 1830 01/27/20 1850  BP: 94/81 103/87 102/70  103/64  Pulse: 75 (!) 107 (!) 114 (!) 106  Resp: 18 (!) 9 17 (!) 27  Temp: 98.4 F (36.9 C)     TempSrc: Oral     SpO2: 100% 95% 94% 95%   No intake or output data in the 24 hours ending 01/27/20 1913 Last 3 Weights 12/04/2016 12/02/2016 11/23/2016  Weight (lbs) 154 lb 154 lb 154 lb 6 oz  Weight (kg) 69.854 kg 69.854 kg 70.024 kg     There is no height or weight on file to calculate BMI.  General:  Well nourished, well developed, in no acute distress HEENT: normal Neck: no JVD Cardiac: irregularly irregular, no appreciable murmurs Lungs:  clear to auscultation bilaterally, no wheezing, rhonchi or rales  Abd: soft, nontender, no hepatomegaly  Ext: no edema Musculoskeletal:  No deformities, BUE and BLE strength normal and equal Skin: warm and dry  Psych:  Normal affect   EKG:  The EKG was personally reviewed and demonstrates: sinus tachycardia, frequent PVCs, ST depressions throughout Telemetry:  Telemetry was personally reviewed and demonstrates:  Frequent PVCs and NSVT without sustained VT  Relevant CV Studies: Echo 2017  The left ventricle is normal in size.  There is normal left ventricular wall thickness.  Mildly reduced LVEF estimated at 45%. There is apical and global hypokinesis and dyssynchrony with pacing leads in the  right heart.  The right ventricle is normal in  size, thickness and function.  The left atrialsize is normal.  The right atrium is normal in size.  The mitral valve leaflets appear normal. There is no evidence of stenosis, fluttering, or prolapse.  The aortic valve is normal in structure and function.  There is mild tricuspid regurgitation.  Right ventricular systolic pressure is estimated to be 24 mmHg.  The aortic root is normal size.  There is no pericardial effusion.    Laboratory Data:  High Sensitivity Troponin:   Recent Labs  Lab 01/27/20 1555  TROPONINIHS 61*     Chemistry Recent Labs  Lab 01/27/20 1555  NA 136  K 4.6  CL 101  CO2 23  GLUCOSE 108*  BUN 20  CREATININE 0.84  CALCIUM 9.7  GFRNONAA >60  GFRAA >60  ANIONGAP 12    No results for input(s): PROT, ALBUMIN, AST, ALT, ALKPHOS, BILITOT in the last 168 hours. Hematology Recent Labs  Lab 01/27/20 1555  WBC 11.0*  RBC 5.05  HGB 15.4  HCT 47.2  MCV 93.5  MCH 30.5  MCHC 32.6  RDW 13.7  PLT 337   BNPNo results for input(s): BNP, PROBNP in the last 168 hours.  DDimer No results for input(s): DDIMER in the last 168 hours.   Radiology/Studies:  DG Chest 2 View  Result Date: 01/27/2020 CLINICAL DATA:  Shortness of breath and chest pain EXAM: CHEST - 2 VIEW COMPARISON:  12/04/2016 FINDINGS: Cardiac shadow is at the upper limits of normal in size. The lungs are well aerated bilaterally. No focal infiltrate or sizable effusion is seen. Defibrillator is again noted and stable. IMPRESSION: No acute abnormality noted. Electronically Signed   By: Alcide Clever M.D.   On: 01/27/2020 16:43    Assessment and Plan:  KWABENA STRUTZ is a 54 y.o. male with a hx of frequent PVC's (s/p ablation 2013), AF, HFrEF s/p ICD 2009 who is being seen today for the evaluation of fatigue, PVC's.  #) Palpitations, ectopy: patient with longstanding issues with palpitations and ventricular ectopy. Has sustained VT noted on device interrogation, however  was in monitor only  zone. Initial concern in ED was for digoxin toxicity, however this does not seem to fit with his story. He took a double dose of his digoxin just once today and his level is only slightly over 2. Also he has no other systemic signs/symptoms of digoxin toxicity and his renal function is normal. Per the patient's report, he has never been on any other antiarrhythmic therapy other than digoxin. His case is complicated given that he is followed in [place] and already had a treatment plan in place. It would be prudent to have our EP team reach out to the patient's EP in [place] and discuss potential options for his care, which include possible drug loading here vs cardiac MRI +/- ablation here.  - EP consult in AM - check TSH - cont home digoxin 0.25mg  daily - cont home metoprolol 25mg  daily, could consider increasing this - would be good to reach out to patient's EP tomorrow to discuss plan of care (Dr. in [place]).  - could consider AAD loading here to bridge him back to returning home vs cardiac MRI +/- ablation here - close electrolyte monitoring - hold lasix as patient does not appear volume overloaded - echo in AM - ok to continue apixaban for AF  #) HFrEF: most recent LVEF 30% per patient report. Has been off Entresto since July as he forgot this in the rush to come out [facility] from [facility]. Appears to be euvolemic at this time after several days of once daily lasix.  - restart entresto (24/26mg  BID) in AM if he has blood pressure room - cont metoprolol as per above - hold lasix for now as patient appears euvolemic - patient was recommended to start dapagliflozin as outpatient but never started this, may be reasonable to start here given the association with arrhythmia reduction   For questions or updates, please contact [facility] Please consult [email or web address] for contact info under     Signed, [place], MD  01/27/2020 7:13 PM

## 2020-01-27 NOTE — ED Triage Notes (Signed)
Patient complains of feeling weak and thinks heart out of rhythm. Has cardiomyopathy and has taken double digoxin and double metoprolol today.  Patient denies CP, complains of SOB and back pain. Alert and oriented

## 2020-01-28 ENCOUNTER — Encounter (HOSPITAL_COMMUNITY): Payer: Self-pay | Admitting: Internal Medicine

## 2020-01-28 ENCOUNTER — Observation Stay (HOSPITAL_COMMUNITY): Payer: Medicare Other

## 2020-01-28 DIAGNOSIS — R778 Other specified abnormalities of plasma proteins: Secondary | ICD-10-CM | POA: Diagnosis present

## 2020-01-28 DIAGNOSIS — Z95810 Presence of automatic (implantable) cardiac defibrillator: Secondary | ICD-10-CM | POA: Diagnosis not present

## 2020-01-28 DIAGNOSIS — I340 Nonrheumatic mitral (valve) insufficiency: Secondary | ICD-10-CM

## 2020-01-28 DIAGNOSIS — I493 Ventricular premature depolarization: Secondary | ICD-10-CM

## 2020-01-28 DIAGNOSIS — I428 Other cardiomyopathies: Secondary | ICD-10-CM | POA: Diagnosis present

## 2020-01-28 DIAGNOSIS — F17210 Nicotine dependence, cigarettes, uncomplicated: Secondary | ICD-10-CM | POA: Diagnosis present

## 2020-01-28 DIAGNOSIS — I361 Nonrheumatic tricuspid (valve) insufficiency: Secondary | ICD-10-CM | POA: Diagnosis not present

## 2020-01-28 DIAGNOSIS — Z885 Allergy status to narcotic agent status: Secondary | ICD-10-CM | POA: Diagnosis not present

## 2020-01-28 DIAGNOSIS — Z7901 Long term (current) use of anticoagulants: Secondary | ICD-10-CM | POA: Diagnosis not present

## 2020-01-28 DIAGNOSIS — I4891 Unspecified atrial fibrillation: Secondary | ICD-10-CM | POA: Diagnosis present

## 2020-01-28 DIAGNOSIS — I471 Supraventricular tachycardia: Secondary | ICD-10-CM | POA: Diagnosis present

## 2020-01-28 DIAGNOSIS — I5043 Acute on chronic combined systolic (congestive) and diastolic (congestive) heart failure: Secondary | ICD-10-CM | POA: Diagnosis present

## 2020-01-28 DIAGNOSIS — E785 Hyperlipidemia, unspecified: Secondary | ICD-10-CM | POA: Diagnosis present

## 2020-01-28 DIAGNOSIS — I480 Paroxysmal atrial fibrillation: Secondary | ICD-10-CM | POA: Diagnosis present

## 2020-01-28 DIAGNOSIS — Z23 Encounter for immunization: Secondary | ICD-10-CM | POA: Diagnosis present

## 2020-01-28 DIAGNOSIS — I472 Ventricular tachycardia: Secondary | ICD-10-CM | POA: Diagnosis present

## 2020-01-28 DIAGNOSIS — F411 Generalized anxiety disorder: Secondary | ICD-10-CM | POA: Diagnosis present

## 2020-01-28 DIAGNOSIS — F329 Major depressive disorder, single episode, unspecified: Secondary | ICD-10-CM | POA: Diagnosis present

## 2020-01-28 DIAGNOSIS — R002 Palpitations: Secondary | ICD-10-CM | POA: Diagnosis present

## 2020-01-28 DIAGNOSIS — F1010 Alcohol abuse, uncomplicated: Secondary | ICD-10-CM | POA: Diagnosis present

## 2020-01-28 DIAGNOSIS — Z20822 Contact with and (suspected) exposure to covid-19: Secondary | ICD-10-CM | POA: Diagnosis present

## 2020-01-28 DIAGNOSIS — I083 Combined rheumatic disorders of mitral, aortic and tricuspid valves: Secondary | ICD-10-CM | POA: Diagnosis present

## 2020-01-28 DIAGNOSIS — Z79899 Other long term (current) drug therapy: Secondary | ICD-10-CM | POA: Diagnosis not present

## 2020-01-28 DIAGNOSIS — I509 Heart failure, unspecified: Secondary | ICD-10-CM

## 2020-01-28 DIAGNOSIS — Z8249 Family history of ischemic heart disease and other diseases of the circulatory system: Secondary | ICD-10-CM | POA: Diagnosis not present

## 2020-01-28 LAB — CBC
HCT: 43.1 % (ref 39.0–52.0)
Hemoglobin: 14.7 g/dL (ref 13.0–17.0)
MCH: 31.9 pg (ref 26.0–34.0)
MCHC: 34.1 g/dL (ref 30.0–36.0)
MCV: 93.5 fL (ref 80.0–100.0)
Platelets: 275 10*3/uL (ref 150–400)
RBC: 4.61 MIL/uL (ref 4.22–5.81)
RDW: 14 % (ref 11.5–15.5)
WBC: 6.9 10*3/uL (ref 4.0–10.5)
nRBC: 0 % (ref 0.0–0.2)

## 2020-01-28 LAB — BASIC METABOLIC PANEL
Anion gap: 8 (ref 5–15)
BUN: 19 mg/dL (ref 6–20)
CO2: 27 mmol/L (ref 22–32)
Calcium: 9.1 mg/dL (ref 8.9–10.3)
Chloride: 103 mmol/L (ref 98–111)
Creatinine, Ser: 0.81 mg/dL (ref 0.61–1.24)
GFR calc Af Amer: >60 mL/min (ref >60)
GFR calc non Af Amer: >60 mL/min (ref >60)
Glucose, Bld: 118 mg/dL — ABNORMAL HIGH (ref 70–99)
Potassium: 3.9 mmol/L (ref 3.5–5.1)
Sodium: 138 mmol/L (ref 135–145)

## 2020-01-28 LAB — MAGNESIUM: Magnesium: 2.1 mg/dL (ref 1.7–2.4)

## 2020-01-28 LAB — ECHOCARDIOGRAM COMPLETE
Calc EF: 26 %
MV M vel: 3.9 m/s
MV Peak grad: 60.8 mmHg
Radius: 0.4 cm
S' Lateral: 6.6 cm
Single Plane A2C EF: 22.1 %
Single Plane A4C EF: 29.3 %

## 2020-01-28 LAB — TSH: TSH: 1.728 u[IU]/mL (ref 0.350–4.500)

## 2020-01-28 LAB — DIGOXIN LEVEL: Digoxin Level: 0.9 ng/mL — ABNORMAL LOW (ref 1.0–2.0)

## 2020-01-28 MED ORDER — SACUBITRIL-VALSARTAN 24-26 MG PO TABS
1.0000 | ORAL_TABLET | Freq: Two times a day (BID) | ORAL | Status: DC
  Administered 2020-01-28 – 2020-01-29 (×2): 1 via ORAL
  Filled 2020-01-28 (×4): qty 1

## 2020-01-28 MED ORDER — IVABRADINE HCL 5 MG PO TABS
5.0000 mg | ORAL_TABLET | Freq: Two times a day (BID) | ORAL | Status: DC
  Administered 2020-01-28 – 2020-01-30 (×4): 5 mg via ORAL
  Filled 2020-01-28 (×5): qty 1

## 2020-01-28 MED ORDER — LORAZEPAM 1 MG PO TABS
1.0000 mg | ORAL_TABLET | Freq: Once | ORAL | Status: AC
  Administered 2020-01-28: 1 mg via ORAL
  Filled 2020-01-28: qty 1

## 2020-01-28 MED ORDER — BUSPIRONE HCL 10 MG PO TABS
10.0000 mg | ORAL_TABLET | Freq: Two times a day (BID) | ORAL | Status: DC
  Administered 2020-01-28 – 2020-01-30 (×4): 10 mg via ORAL
  Filled 2020-01-28 (×6): qty 1

## 2020-01-28 MED ORDER — INFLUENZA VAC SPLIT QUAD 0.5 ML IM SUSY
0.5000 mL | PREFILLED_SYRINGE | INTRAMUSCULAR | Status: AC
  Administered 2020-01-30: 0.5 mL via INTRAMUSCULAR

## 2020-01-28 MED ORDER — FUROSEMIDE 10 MG/ML IJ SOLN
40.0000 mg | Freq: Once | INTRAMUSCULAR | Status: AC
  Administered 2020-01-28: 40 mg via INTRAVENOUS
  Filled 2020-01-28: qty 4

## 2020-01-28 NOTE — Progress Notes (Signed)
  Echocardiogram 2D Echocardiogram has been performed.  Dennis Mason 01/28/2020, 10:51 AM

## 2020-01-28 NOTE — Consult Note (Addendum)
Cardiology Consultation:   Patient ID: Dennis Mason MRN: [id number]; DOB: [birth year]  Admit date: 01/27/2020 Date of Consult: 01/28/2020  Primary Care Provider: Oneita Hurt No [facility] Cardiologist/[facility] Electrophysiologist:  Dr. Susa Loffler in [place], [place]   Patient Profile:   Dennis Mason is a 54 y.o. male with a hx of (noted below) who is being seen today for the evaluation of palpitations, PVCs, VT at the request of Dr. Liane Comber.  History of Present Illness:   Dennis Mason saw a cardiology team 10/26/2019, this it seem to evaluate his device for MRI comparability at [facility].  He is reported to have h/o NICM, ICD that originally was implanted 2009 for primary preventionand underwent gen change while here visiting family by Dr. Elberta Fortis in 2018. Known to have frequent PVCs w/history of an ablation 2013, AFib (described as paroxysmal), HLD, chronic CHF (last known LVEF in 2017 45%) Unclear, but seem perhaps device not compatible but felt safe to proceed with MRI.  On noted shock that was reported as inappropriate  He is in [place] helping to settle his mother affairs after her recent death.  The last few days with an up tick in palpitations and generally not feeling well, has had near fainting, no syncope.  Suspect some fluid retention and taking his PRN lasix (and K+), is here without his Sherryll Burger, otherwise taking his home meds and at the advice of his cardiologist via phone, took an additional dig dose.  Not feeling better cam to the ER yesterday.  There noted with frequent PVS and NSVT, ICD interrogation reportedly noting frequent atrial fibrillation and 6 monitored episodes of VT, the longest of which being 7 minutes in duration (not abailable for review). Cardiology was consulted Their HPI elicited recent plans for repeat ablation procedure, though his Mom's passing having delayed this.  Also apparently plans for farxiga though had not yet started it He was admitted and  planned for EP consult.  Not felt to be volume OL and his lasix held Echo was ordered  He tells me he has felt in the last few days like he was winded, felt like his lungs were filling up with fluid, taking lasix did seem to help, but generally feeling exhausted, no energy and generally worse then his baseline No CP Aware of his PVCs, they are very bothersome and hav increased He has felt weak and a bit lightheaded now and then, though denies any near syncope or syncope.  He reports having to take the lasix is very unusual for him, rarely if ever needs to use it.  Though when he has had failure in the past feels like he does now, generally hot, sweaty, weak, SOB. He has no SOB now, but continues to feel poorly  He reports that in July he really had to drop everything to come and care for his Mom, she has passed away and now trying to deal with her estate.  He is an only child and this has been extraordinarily stressful for him, missed her a great deal, and mentions his girlfriend (of years) left him for another person during all of this and quite upsetting as well. He has been without his Entresto since here Has missed a couple doses of his Eliquis (is in SR) And has been taking his metoprolol Baseline BPs for him 90''s-110's, of late higher with hi stress levels up  LABS K+ 4.6, 3.9 BUN/Creat 19/0.81 BNP 991 HS Trop 61, 73 WBC 6.9 H/H 14/43 Plts 275  TSH 1.728 Dig 2.3 CXR clear resp panel and COVID neg  Past Medical History:  Diagnosis Date  . Cardiomyopathy (HCC)   . CHF (congestive heart failure) (HCC)   . Depression     Past Surgical History:  Procedure Laterality Date  . CARDIAC DEFIBRILLATOR PLACEMENT     also pacemaker  . HAND SURGERY Right    reconstruction  . ICD GENERATOR CHANGEOUT N/A 12/02/2016   Procedure: ICD Generator Changeout;  Surgeon: Regan Lemming, MD;  Location: [facility] INVASIVE CV LAB;  Service: Cardiovascular;  Laterality: N/A;     Home  Medications:  Prior to Admission medications   Medication Sig Start Date End Date Taking? Authorizing Provider  apixaban (ELIQUIS) 5 MG TABS tablet Take 5 mg by mouth in the morning and at bedtime. 10/22/19  Yes [provider]  digoxin (LANOXIN) 0.25 MG tablet Take 250 mcg by mouth daily. 10/24/19  Yes [provider]  escitalopram (LEXAPRO) 20 MG tablet Take 20 mg by mouth daily. 10/06/15  Yes [provider]  furosemide (LASIX) 40 MG tablet Take 40 mg by mouth daily as needed for fluid.  11/20/19  Yes [provider]  KLOR-CON M10 10 MEQ tablet Take 10 mEq by mouth daily as needed (when taking water pill).  11/20/19  Yes [provider]  metoprolol succinate (TOPROL-XL) 25 MG 24 hr tablet Take 25 mg by mouth daily. 11/20/19  Yes [provider]  tadalafil (CIALIS) 20 MG tablet Take 1 tablet by mouth daily as needed for erectile dysfunction.  04/13/18  Yes [provider]  zolpidem (AMBIEN) 10 MG tablet Take 10 mg by mouth at bedtime as needed for sleep.   Yes [provider]  sacubitril-valsartan (ENTRESTO) 24-26 MG Take 1 tablet by mouth 2 (two) times daily. Patient not taking: Reported on 01/27/2020 11/01/19   [provider]    Inpatient Medications: Scheduled Meds: . apixaban  5 mg Oral BID  . atorvastatin  40 mg Oral Daily  . digoxin  250 mcg Oral Daily  . escitalopram  20 mg Oral Daily  . metoprolol succinate  25 mg Oral Daily   Continuous Infusions:  PRN Meds: ondansetron (ZOFRAN) IV, zolpidem  Allergies:    Allergies  Allergen Reactions  . Percocet [Oxycodone-Acetaminophen] Itching    Social History:   Social History   Socioeconomic History  . Marital status: Married    Spouse name: Not on file  . Number of children: Not on file  . Years of education: Not on file  . Highest education level: Not on file  Occupational History  . Not on file  Tobacco Use  . Smoking status: Never Smoker  .  Smokeless tobacco: Never Used  Substance and Sexual Activity  . Alcohol use: No  . Drug use: No  . Sexual activity: Not on file  Other Topics Concern  . Not on file  Social History Narrative  . Not on file   Social Determinants of Health   Financial Resource Strain:   . Difficulty of Paying Living Expenses: Not on file  Food Insecurity:   . Worried About Programme researcher, broadcasting/film/video in the Last Year: Not on file  . Ran Out of Food in the Last Year: Not on file  Transportation Needs:   . Lack of Transportation (Medical): Not on file  . Lack of Transportation (Non-Medical): Not on file  Physical Activity:   . Days of Exercise per Week: Not on file  . Minutes  of Exercise per Session: Not on file  Stress:   . Feeling of Stress : Not on file  Social Connections:   . Frequency of Communication with Friends and Family: Not on file  . Frequency of Social Gatherings with Friends and Family: Not on file  . Attends Religious Services: Not on file  . Active Member of Clubs or Organizations: Not on file  . Attends Banker Meetings: Not on file  . Marital Status: Not on file  Intimate Partner Violence:   . Fear of Current or Ex-Partner: Not on file  . Emotionally Abused: Not on file  . Physically Abused: Not on file  . Sexually Abused: Not on file    Family History:   Family History  Problem Relation Age of Onset  . Heart Problems Mother      ROS:  Please see the history of present illness.  All other ROS reviewed and negative.     Physical Exam/Data:   Vitals:   01/28/20 0100 01/28/20 0119 01/28/20 0133 01/28/20 0616  BP: 98/68  93/62 99/79  Pulse: 96  86 (!) 52  Resp: (!) 34  16 17  Temp:  98.2 F (36.8 C) 98.8 F (37.1 C) 98.6 F (37 C)  TempSrc:  Oral Oral Oral  SpO2: 93%   94%   No intake or output data in the 24 hours ending 01/28/20 0734 Last 3 Weights 12/04/2016 12/02/2016 11/23/2016  Weight (lbs) 154 lb 154 lb 154 lb 6 oz  Weight (kg) 69.854 kg 69.854 kg  70.024 kg     There is no height or weight on file to calculate BMI.  General:  Well nourished, well developed, in no acute distress HEENT: normal Lymph: no adenopathy Neck: no JVD Endocrine:  No thryomegaly Vascular: No carotid bruits Cardiac:  RRR; extrasystoles noted,  no murmurs, gallops or rubs Lungs:  CTA b/l, no wheezing, rhonchi or rales  Abd: soft, nontender, no hepatomegaly  Ext: no edema Musculoskeletal:  No deformities, Skin: warm and dry  Neuro:  no focal abnormalities noted Psych:  Normal affect   EKG:  The EKG was personally reviewed and demonstrates:   ST 115bpm, PVCs, 3neat NSVT, QRS  OLD 11/23/2016 SR, V bigeminy  Telemetry:  Telemetry was personally reviewed and demonstrates:   SR, frequent PVCs, NSVT longest and fastes was  12beats  Appears to have a couple/few morphologies  Relevant CV Studies:  Echo 2017  The left ventricle is normal in size.  There is normal left ventricular wall thickness.  Mildly reduced LVEF estimated at 45%. There is apical and global hypokinesis and dyssynchrony with pacing leads in the  right heart.  The right ventricle is normal in size, thickness and function.  The left atrialsize is normal.  The right atrium is normal in size.  The mitral valve leaflets appear normal. There is no evidence of stenosis, fluttering, or prolapse.  The aortic valve is normal in structure and function.  There is mild tricuspid regurgitation.  Right ventricular systolic pressure is estimated to be 24 mmHg.  The aortic root is normal size.  There is no pericardial effusion.   Laboratory Data:  High Sensitivity Troponin:   Recent Labs  Lab 01/27/20 1555 01/27/20 1834  TROPONINIHS 61* 73*     Chemistry Recent Labs  Lab 01/27/20 1555 01/28/20 0405  NA 136 138  K 4.6 3.9  CL 101 103  CO2 23 27  GLUCOSE 108* 118*  BUN 20 19  CREATININE 0.84 0.81  CALCIUM 9.7 9.1  GFRNONAA >60 >60  GFRAA >60 >60  ANIONGAP 12 8    No  results for input(s): PROT, ALBUMIN, AST, ALT, ALKPHOS, BILITOT in the last 168 hours. Hematology Recent Labs  Lab 01/27/20 1555 01/28/20 0405  WBC 11.0* 6.9  RBC 5.05 4.61  HGB 15.4 14.7  HCT 47.2 43.1  MCV 93.5 93.5  MCH 30.5 31.9  MCHC 32.6 34.1  RDW 13.7 14.0  PLT 337 275   BNP Recent Labs  Lab 01/27/20 1555  BNP 991.7*    DDimer No results for input(s): DDIMER in the last 168 hours.   Radiology/Studies:  DG Chest 2 View  Result Date: 01/27/2020 CLINICAL DATA:  Shortness of breath and chest pain EXAM: CHEST - 2 VIEW COMPARISON:  12/04/2016 FINDINGS: Cardiac shadow is at the upper limits of normal in size. The lungs are well aerated bilaterally. No focal infiltrate or sizable effusion is seen. Defibrillator is again noted and stable. IMPRESSION: No acute abnormality noted. Electronically Signed   By: Alcide Clever M.D.   On: 01/27/2020 16:43    Assessment and Plan:   1. Palpitations  2. PAFib  3. PVCs, NSVT 4. VT (monitored)  Interrogation is not available for my review, I am unable to see his CLE given not our pt MDT will interrogate the device this morning and are aware.  He did not get his metoprolol last night 2/2 BP I have written not to hold unless <90 Get dig level today prior to dig being given and a mag level  5. AFib     CHA2DS2Vasc is one, maintained on Eliquis I am not convince any of the AFib episodes are real, though is a known diagnosis for him   5. HS trop abnormal     He denies any CP     No stress test or ischemic w/u in many years  6. Chronic CHF (systolic)     Exam and CXR do not suggest volume OL     Optivol trending upwards though still below threshold     Pending new echo       I am not certain that ablation is ideal given multiple morphologies MRI planned in [place] would be helpful and f/u with his EP/tema likely the best strategy I will discuss with Dr. Graciela Husbands, though AAD would likely be amio, not ideal for this young  patient If BP allows would favor titration of his BB The patient would like to avoid amiodarone  Await his device interrogation Dr. Graciela Husbands will see later this AM    For questions or updates, please contact [facility] Please consult [email or web address] for contact info under    Signed, Sheilah Pigeon, PA-C  01/28/2020 7:34 AM  Supraventricular tachycardia--recurrent  Sinus tachycardia  Atrial fibrillation-false positive detection  VT-nonsustained  PVCs-frequent  Nonischemic cardiomyopathy  Congestive heart failure-acute/chronic/systolic/diastolic  Alcohol use  Psychosocial stress  Anxiety  The patient presents with acute on chronic heart failure in the setting of palpitations.  A couple of medication issues may be contributing, being off of his Entresto, acutely discontinuing his Lexapro.  He is volume overloaded.  And has had some nonsustained ventricular tachycardia over the last 36 hours in the context of the above.  Initial device interpretation suggested that there was sustained ventricular tachycardia below detection; on further review I think it is SVT.  Also has sinus tachycardia.  Reviewing his cardiac Compass his mean heart rate from the last 3 months  and August through January was greater than 100 bpm.  Beta-blocker up titration has been precluded by hypotension.  We will begin him on ivabradine  He has frequent PVCs and repeat ablation has been discussed with his EP doctor at home.  Will defer that for now.  With his heart failure, #1-IV Lasix #2-resume Entresto and low-dose beta-blocker  #3-await echocardiogram #4-medicine consultation assistance with his significant anxiety #5-await repeat dig level &  Hold dig level for now  #6-Ivabradine #7-we will try to get up with his EP doctor in [place]--  management of his PVCs and SVT will be deferred

## 2020-01-28 NOTE — Consult Note (Signed)
Medical Consultation   Dennis Mason  ACZ:[id number]  DOB: 1965/10/09  DOA: 01/27/2020  PCP: Oneita Hurt, No   Requesting physician: Luster Landsberg PA  Reason for consultation: Anxiety  History of Present Illness: Dennis Mason is an 54 y.o. male with history of nonischemic cardiomyopathy status post ICD placement, chronic CHF, hyperlipidemia admitted under cardiology service for evaluation of PVCs and fatigue.  Patient tells me that he moved from [place] to [place] in July 2021 as he had to take care of his mother who died on 2024/09/01due to metastatic colon cancer.  He tells me that he has been going through a lot as his girlfriend left him recently and friend died of COVID-19 infection, his dad died 2 years ago and his dog last year.  Reports generalized anxiety and depressed mood, has trouble falling, little to no interest in his favorite activities, feeling low and depressed however denies suicidal or homicidal thoughts.  He has been on Lexapro 20 mg since 14 years and has been compliant with it.  He tells me that Lexapro is helpful however he continues to have anxiety/depression symptoms.  He tried BuSpar in the past which helped and would like to be back on BuSpar.  He reports cigarette smoking three-quarter pack of cigarettes per day, drinks 8 beers per night however denies illicit drug use.  He tells me that he sees grief counselor.  he requested to see a psychiatrist while he is hospitalized.  He received Ativan last night and this morning which helped somewhat with his anxiety.  He denies headache, blurry vision, chest pain, leg swelling, abdominal pain, nausea, vomiting, decreased appetite, weight loss, night sweats, diarrhea.  Past Medical History:  Diagnosis Date  . Cardiomyopathy (HCC)   . CHF (congestive heart failure) (HCC)   . Depression          Review of Systems:  ROS As per HPI otherwise 10 point review of systems negative.     Past Medical  History: Past Medical History:  Diagnosis Date  . Cardiomyopathy (HCC)   . CHF (congestive heart failure) (HCC)   . Depression     Past Surgical History: Past Surgical History:  Procedure Laterality Date  . CARDIAC DEFIBRILLATOR PLACEMENT     also pacemaker  . HAND SURGERY Right    reconstruction  . ICD GENERATOR CHANGEOUT N/A 12/02/2016   Procedure: ICD Generator Changeout;  Surgeon: Regan Lemming, MD;  Location: [facility] INVASIVE CV LAB;  Service: Cardiovascular;  Laterality: N/A;     Allergies:   Allergies  Allergen Reactions  . Percocet [Oxycodone-Acetaminophen] Itching     Social History:  reports that he has never smoked. He has never used smokeless tobacco. He reports that he does not drink alcohol and does not use drugs.   Family History: Family History  Problem Relation Age of Onset  . Heart Problems Mother       Physical Exam: Vitals:   01/28/20 0119 01/28/20 0133 01/28/20 0616 01/28/20 1232  BP:  93/62 99/79 (!) 103/45  Pulse:  86 (!) 52 91  Resp:  16 17 17   Temp: 98.2 F (36.8 C) 98.8 F (37.1 C) 98.6 F (37 C) 98 F (36.7 C)  TempSrc: Oral Oral Oral Oral  SpO2:   94% 98%    Constitutional: Appearance,  Alert and awake, oriented x3, not in any acute distress. Eyes: PERLA, EOMI, irises appear normal, anicteric  sclera,  ENMT: external ears and nose appear normal, normal hearing or hard of hearing            Lips appears normal, oropharynx mucosa, tongue, posterior pharynx appear normal  Neck: neck appears normal, no masses, normal ROM, no thyromegaly, no JVD  CVS: S1-S2 clear, no murmur rubs or gallops, no LE edema, normal pedal pulses  Respiratory:  clear to auscultation bilaterally, no wheezing, rales or rhonchi. Respiratory effort normal. No accessory muscle use.  Abdomen: soft nontender, nondistended, normal bowel sounds, no hepatosplenomegaly, no hernias  Musculoskeletal: : no cyanosis, clubbing or edema noted bilaterally                        Joint/bones/muscle exam, strength, contractures or atrophy Neuro: Cranial nerves II-XII intact, strength, sensation, reflexes Psych: judgement and insight appear normal, stable mood and affect, mental status Skin: no rashes or lesions or ulcers, no induration or nodules    Data reviewed:  I have personally reviewed following labs and imaging studies Labs:  CBC: Recent Labs  Lab 01/27/20 1555 01/28/20 0405  WBC 11.0* 6.9  HGB 15.4 14.7  HCT 47.2 43.1  MCV 93.5 93.5  PLT 337 275    Basic Metabolic Panel: Recent Labs  Lab 01/27/20 1555 01/28/20 0405 01/28/20 1031  NA 136 138  --   K 4.6 3.9  --   CL 101 103  --   CO2 23 27  --   GLUCOSE 108* 118*  --   BUN 20 19  --   CREATININE 0.84 0.81  --   CALCIUM 9.7 9.1  --   MG  --   --  2.1   GFR CrCl cannot be calculated (Unknown ideal weight.). Liver Function Tests: No results for input(s): AST, ALT, ALKPHOS, BILITOT, PROT, ALBUMIN in the last 168 hours. No results for input(s): LIPASE, AMYLASE in the last 168 hours. No results for input(s): AMMONIA in the last 168 hours. Coagulation profile No results for input(s): INR, PROTIME in the last 168 hours.  Cardiac Enzymes: No results for input(s): CKTOTAL, CKMB, CKMBINDEX, TROPONINI in the last 168 hours. BNP: Invalid input(s): POCBNP CBG: No results for input(s): GLUCAP in the last 168 hours. D-Dimer No results for input(s): DDIMER in the last 72 hours. Hgb A1c No results for input(s): HGBA1C in the last 72 hours. Lipid Profile No results for input(s): CHOL, HDL, LDLCALC, TRIG, CHOLHDL, LDLDIRECT in the last 72 hours. Thyroid function studies Recent Labs    01/28/20 0405  TSH 1.728   Anemia work up No results for input(s): VITAMINB12, FOLATE, FERRITIN, TIBC, IRON, RETICCTPCT in the last 72 hours. Urinalysis No results found for: COLORURINE, APPEARANCEUR, LABSPEC, PHURINE, GLUCOSEU, HGBUR, BILIRUBINUR, KETONESUR, PROTEINUR, UROBILINOGEN, NITRITE,  LEUKOCYTESUR   Microbiology Recent Results (from the past 240 hour(s))  Respiratory Panel by RT PCR (Flu A&B, Covid) - Nasopharyngeal Swab     Status: None   Collection Time: 01/27/20  9:00 PM   Specimen: Nasopharyngeal Swab  Result Value Ref Range Status   SARS Coronavirus 2 by RT PCR NEGATIVE NEGATIVE Final    Comment: (NOTE) SARS-CoV-2 target nucleic acids are NOT DETECTED.  The SARS-CoV-2 RNA is generally detectable in upper respiratoy specimens during the acute phase of infection. The lowest concentration of SARS-CoV-2 viral copies this assay can detect is 131 copies/mL. A negative result does not preclude SARS-Cov-2 infection and should not be used as the sole basis for treatment or other patient management  decisions. A negative result may occur with  improper specimen collection/handling, submission of specimen other than nasopharyngeal swab, presence of viral mutation(s) within the areas targeted by this assay, and inadequate number of viral copies (<131 copies/mL). A negative result must be combined with clinical observations, patient history, and epidemiological information. The expected result is Negative.  Fact Sheet for Patients:  https://www.moore.com/  Fact Sheet for Healthcare Providers:  https://www.young.biz/  This test is no t yet approved or cleared by the [place] FDA and  has been authorized for detection and/or diagnosis of SARS-CoV-2 by FDA under an Emergency Use Authorization (EUA). This EUA will remain  in effect (meaning this test can be used) for the duration of the COVID-19 declaration under Section 564(b)(1) of the Act, 21 U.S.C. section 360bbb-3(b)(1), unless the authorization is terminated or revoked sooner.     Influenza A by PCR NEGATIVE NEGATIVE Final   Influenza B by PCR NEGATIVE NEGATIVE Final    Comment: (NOTE) The Xpert Xpress SARS-CoV-2/FLU/RSV assay is intended as an aid in  the diagnosis  of influenza from Nasopharyngeal swab specimens and  should not be used as a sole basis for treatment. Nasal washings and  aspirates are unacceptable for Xpert Xpress SARS-CoV-2/FLU/RSV  testing.  Fact Sheet for Patients: https://www.moore.com/  Fact Sheet for Healthcare Providers: https://www.young.biz/  This test is not yet approved or cleared by the [place] FDA and  has been authorized for detection and/or diagnosis of SARS-CoV-2 by  FDA under an Emergency Use Authorization (EUA). This EUA will remain  in effect (meaning this test can be used) for the duration of the  Covid-19 declaration under Section 564(b)(1) of the Act, 21  U.S.C. section 360bbb-3(b)(1), unless the authorization is  terminated or revoked. Performed at [facility], [address], [place], [place] [address]        Inpatient Medications:   Scheduled Meds: . apixaban  5 mg Oral BID  . atorvastatin  40 mg Oral Daily  . escitalopram  20 mg Oral Daily  . ivabradine  5 mg Oral BID WC  . metoprolol succinate  25 mg Oral Daily  . sacubitril-valsartan  1 tablet Oral BID   Continuous Infusions:   Radiological Exams on Admission: DG Chest 2 View  Result Date: 01/27/2020 CLINICAL DATA:  Shortness of breath and chest pain EXAM: CHEST - 2 VIEW COMPARISON:  12/04/2016 FINDINGS: Cardiac shadow is at the upper limits of normal in size. The lungs are well aerated bilaterally. No focal infiltrate or sizable effusion is seen. Defibrillator is again noted and stable. IMPRESSION: No acute abnormality noted. Electronically Signed   By: Alcide Clever M.D.   On: 01/27/2020 16:43   ECHOCARDIOGRAM COMPLETE  Result Date: 01/28/2020    ECHOCARDIOGRAM REPORT   Patient Name:   Dennis Mason Date of Exam: 01/28/2020 Medical Rec #:  [id number]        Height:       66.0 in Accession #:    [id number]       Weight:       154.0 lb Date of Birth:  28-Dec-1965         BSA:          1.790 m  Patient Age:    54 years         BP:           99/79 mmHg Patient Gender: M  HR:           96 bpm. Exam Location:  Inpatient Procedure: 2D Echo, Cardiac Doppler and Color Doppler Indications:    Cardiomyopathy-Unspecified 425.9 / I42.9  History:        Patient has prior history of Echocardiogram examinations, most                 recent 05/13/2015. Arrythmias:PVC; Risk Factors:Non-Smoker.  Sonographer:    Renella Cunas RDCS Referring Phys: [id number] ANTHONY PHILIP CARNICELLI IMPRESSIONS  1. Left ventricular ejection fraction, by estimation, is 20 to 25%. The left ventricle has severely decreased function. The left ventricle demonstrates global hypokinesis. The left ventricular internal cavity size was severely dilated. Left ventricular diastolic parameters are consistent with Grade III diastolic dysfunction (restrictive). Elevated left atrial pressure.  2. Right ventricular systolic function is normal. The right ventricular size is normal. There is normal pulmonary artery systolic pressure. The estimated right ventricular systolic pressure is 25.1 mmHg.  3. Left atrial size was moderately dilated.  4. The mitral valve is normal in structure. Moderate mitral valve regurgitation. No evidence of mitral stenosis.  5. The aortic valve is normal in structure. Aortic valve regurgitation is trivial. No aortic stenosis is present.  6. The inferior vena cava is normal in size with greater than 50% respiratory variability, suggesting right atrial pressure of 3 mmHg. FINDINGS  Left Ventricle: Left ventricular ejection fraction, by estimation, is 20 to 25%. The left ventricle has severely decreased function. The left ventricle demonstrates global hypokinesis. The left ventricular internal cavity size was severely dilated. There is no left ventricular hypertrophy. Left ventricular diastolic parameters are consistent with Grade III diastolic dysfunction (restrictive). Elevated left atrial pressure. Right Ventricle: The  right ventricular size is normal. No increase in right ventricular wall thickness. Right ventricular systolic function is normal. There is normal pulmonary artery systolic pressure. The tricuspid regurgitant velocity is 2.35 m/s, and  with an assumed right atrial pressure of 3 mmHg, the estimated right ventricular systolic pressure is 25.1 mmHg. Left Atrium: Left atrial size was moderately dilated. Right Atrium: Right atrial size was normal in size. Pericardium: There is no evidence of pericardial effusion. Mitral Valve: The mitral valve is normal in structure. Moderate mitral valve regurgitation. No evidence of mitral valve stenosis. Tricuspid Valve: The tricuspid valve is normal in structure. Tricuspid valve regurgitation is mild . No evidence of tricuspid stenosis. Aortic Valve: The aortic valve is normal in structure. Aortic valve regurgitation is trivial. No aortic stenosis is present. Pulmonic Valve: The pulmonic valve was normal in structure. Pulmonic valve regurgitation is not visualized. No evidence of pulmonic stenosis. Aorta: The aortic root is normal in size and structure. Venous: The inferior vena cava is normal in size with greater than 50% respiratory variability, suggesting right atrial pressure of 3 mmHg. IAS/Shunts: No atrial level shunt detected by color flow Doppler.  LEFT VENTRICLE PLAX 2D LVIDd:         7.20 cm      Diastology LVIDs:         6.60 cm      LV e' medial:  4.62 cm/s LV PW:         0.80 cm      LV e' lateral: 6.08 cm/s LV IVS:        0.80 cm LVOT diam:     2.50 cm LV SV:         45 LV SV Index:   25 LVOT Area:  4.91 cm  LV Volumes (MOD) LV vol d, MOD A2C: 271.0 ml LV vol d, MOD A4C: 311.0 ml LV vol s, MOD A2C: 211.0 ml LV vol s, MOD A4C: 220.0 ml LV SV MOD A2C:     60.0 ml LV SV MOD A4C:     311.0 ml LV SV MOD BP:      76.0 ml RIGHT VENTRICLE RV S prime:     11.20 cm/s TAPSE (M-mode): 1.6 cm LEFT ATRIUM             Index       RIGHT ATRIUM           Index LA diam:        4.90 cm  2.74 cm/m  RA Area:     17.60 cm LA Vol (A2C):   87.3 ml 48.78 ml/m RA Volume:   44.30 ml  24.75 ml/m LA Vol (A4C):   54.9 ml 30.68 ml/m LA Biplane Vol: 69.6 ml 38.89 ml/m  AORTIC VALVE LVOT Vmax:   65.30 cm/s LVOT Vmean:  42.700 cm/s LVOT VTI:    0.091 m  AORTA Ao Root diam: 2.90 cm MR Peak grad:    60.8 mmHg   TRICUSPID VALVE MR Mean grad:    34.0 mmHg   TR Peak grad:   22.1 mmHg MR Vmax:         390.00 cm/s TR Vmax:        235.00 cm/s MR Vmean:        267.0 cm/s MR PISA:         1.01 cm    SHUNTS MR PISA Eff ROA: 10 mm      Systemic VTI:  0.09 m MR PISA Radius:  0.40 cm     Systemic Diam: 2.50 cm Tobias Alexander MD Electronically signed by Tobias Alexander MD Signature Date/Time: 01/28/2020/1:17:19 PM    Final     Impression/Recommendations Principal Problem:   PVC (premature ventricular contraction) Active Problems:   CHF (congestive heart failure) (HCC)   Generalized anxiety disorder   Elevated troponin   A-fib (HCC)   Generalized anxiety disorder/depression: -Continue Lexapro 20 mg once daily.  Patient denies active suicidal or homicidal thoughts.  Start BuSpar 10 mg twice daily and then go up on dose after a week -Consult psychiatry as per patient's request.  PVCs/palpitation: -TSH: WNL, magnesium: WNL.  Vital signs stable. -Evaluated by EP.  On telemetry. -Continue metoprolol.  Nonischemic cardiomyopathy status post ICD/chronic combined systolic and diastolic CHF:  -Patient appears euvolemic on exam.  Echo from today shows ejection fraction of 20 to 25% with grade 3 diastolic dysfunction. -Continue digoxin, Lasix, metoprolol, Entresto per cardiology. -Strict INO's and daily weight.  Monitor electrolytes closely.  Paroxysmal A. fib: Continue Eliquis and metoprolol.  Tobacco/alcohol abuse: Counseled about cessation  Thank you for this consultation.  Our [facility] hospitalist team will follow the patient with you.   Time Spent: 40 minutes  Ollen Bowl M.D. [facility] 01/28/2020, 2:10 PM

## 2020-01-29 LAB — BASIC METABOLIC PANEL
Anion gap: 9 (ref 5–15)
BUN: 17 mg/dL (ref 6–20)
CO2: 27 mmol/L (ref 22–32)
Calcium: 8.9 mg/dL (ref 8.9–10.3)
Chloride: 101 mmol/L (ref 98–111)
Creatinine, Ser: 0.86 mg/dL (ref 0.61–1.24)
GFR calc Af Amer: >60 mL/min (ref >60)
GFR calc non Af Amer: >60 mL/min (ref >60)
Glucose, Bld: 101 mg/dL — ABNORMAL HIGH (ref 70–99)
Potassium: 3.6 mmol/L (ref 3.5–5.1)
Sodium: 137 mmol/L (ref 135–145)

## 2020-01-29 MED ORDER — METOPROLOL SUCCINATE ER 25 MG PO TB24
25.0000 mg | ORAL_TABLET | Freq: Every day | ORAL | Status: DC
  Administered 2020-01-29: 25 mg via ORAL
  Filled 2020-01-29: qty 1

## 2020-01-29 MED ORDER — LORAZEPAM 0.5 MG PO TABS
0.5000 mg | ORAL_TABLET | Freq: Once | ORAL | Status: AC
  Administered 2020-01-29: 0.5 mg via ORAL
  Filled 2020-01-29: qty 1

## 2020-01-29 MED ORDER — POTASSIUM CHLORIDE CRYS ER 20 MEQ PO TBCR
40.0000 meq | EXTENDED_RELEASE_TABLET | Freq: Once | ORAL | Status: AC
  Administered 2020-01-29: 40 meq via ORAL
  Filled 2020-01-29: qty 2

## 2020-01-29 MED ORDER — SACUBITRIL-VALSARTAN 24-26 MG PO TABS
1.0000 | ORAL_TABLET | Freq: Two times a day (BID) | ORAL | Status: DC
  Administered 2020-01-29 – 2020-01-30 (×2): 1 via ORAL
  Filled 2020-01-29 (×4): qty 1

## 2020-01-29 MED ORDER — METOPROLOL SUCCINATE ER 25 MG PO TB24: 12.5000 mg | ORAL_TABLET | Freq: Every day | ORAL | Status: DC

## 2020-01-29 MED ORDER — FUROSEMIDE 20 MG PO TABS: 20.0000 mg | ORAL_TABLET | Freq: Every day | ORAL | Status: DC

## 2020-01-29 MED ORDER — ALPRAZOLAM 0.25 MG PO TABS
0.2500 mg | ORAL_TABLET | Freq: Two times a day (BID) | ORAL | Status: DC | PRN
  Administered 2020-01-29: 0.25 mg via ORAL
  Filled 2020-01-29: qty 1

## 2020-01-29 NOTE — Progress Notes (Signed)
Progress Note    Dennis Mason  OJJ:[id number] DOB: 1966/01/02  DOA: 01/27/2020 PCP: Pcp, No    Brief Narrative:     Medical records reviewed and are as summarized below:  Dennis Mason is an 54 y.o. male with history of nonischemic cardiomyopathy status post ICD placement, chronic CHF, hyperlipidemia admitted under cardiology service for evaluation of PVCs and fatigue.  Patient tells me that he moved from [place] to [place] in July 2021 as he had to take care of his mother who died on 2024/08/23due to metastatic colon cancer.  He tells me that he has been going through a lot as his girlfriend left him recently and friend died of COVID-19 infection, his dad died 2 years ago and his dog last year.  Reports generalized anxiety and depressed mood, has trouble falling, little to no interest in his favorite activities, feeling low and depressed however denies suicidal or homicidal thoughts.  Assessment/Plan:   Principal Problem:   PVC (premature ventricular contraction) Active Problems:   CHF (congestive heart failure) (HCC)   Generalized anxiety disorder   Elevated troponin   A-fib (HCC)   PVC's (premature ventricular contractions)   Generalized anxiety disorder/depression: -Continue Lexapro 20 mg once daily.  Patient denies active suicidal or homicidal thoughts.  Started BuSpar 10 mg twice daily and then go up on dose after a week - psychiatry as per patient's request: Plan We will continue Lexapro 20 mg p.o. daily for depression and anxiety.  Previous trial of buspirone was tolerated well, therefore this medication was also started.  BuSpar 10 mg p.o. twice daily for anxiety.  Patient states he plans to return home to [place], and plans to resume services at the [facility].  So at this time he is declining any outpatient resources in this area.  Patient has also declined speaking with the chaplain during his hospital admission.  He is made aware that this service  is available to him if he changes his mind.  He currently is denying all suicidal ideations, homicidal ideations  PVCs/palpitation: -TSH: WNL, magnesium: WNL.  Vital signs stable. -Evaluated by EP.  On telemetry. -Continue metoprolol.  Nonischemic cardiomyopathy status post ICD/chronic combined systolic and diastolic CHF:  -Patient appears euvolemic on exam.  Echo from today shows ejection fraction of 20 to 25% with grade 3 diastolic dysfunction. -Continue digoxin, Lasix, metoprolol, Entresto per cardiology. -Strict INO's and daily weight.  Monitor electrolytes closely.  Paroxysmal A. fib: Continue Eliquis and metoprolol  -per cards  Tobacco/alcohol abuse: Counseled about cessation -patient showing no signs of withdrawl   Overall issues seem to be most related to breakup with girlfriend and her new relationship.    Will need close follow up with PCP And referral for counseling in [place] by PCP        Subjective:   Upset ex-girlfriend texted him this AM but then has been ignoring him all day  Objective:    Vitals:   01/29/20 0820 01/29/20 0921 01/29/20 1040 01/29/20 1508  BP: (!) 72/69 (!) 83/67 98/69 92/62   Pulse: 89   93  Resp: 16  17 17   Temp: 98.7 F (37.1 C)  98.3 F (36.8 C) 98.3 F (36.8 C)  TempSrc: Oral  Oral Oral  SpO2: 94%  95% 95%  Weight:      Height:        Intake/Output Summary (Last 24 hours) at 01/29/2020 1703 Last data filed at 01/29/2020 0800 Gross per 24 hour  Intake 125 ml  Output 100 ml  Net 25 ml   Filed Weights   01/28/20 1600  Weight: 66.5 kg    Exam:  General: Appearance:    Well developed, well nourished male in no acute distress     Lungs:     respirations unlabored  Heart:    Normal heart rate.   MS:   All extremities are intact.   Neurologic:   Awake, alert, oriented x 3. No apparent focal neurological           defect.     Data Reviewed:   I have personally reviewed following labs and imaging  studies:  Labs: Labs show the following:   Basic Metabolic Panel: Recent Labs  Lab 01/27/20 1555 01/27/20 1555 01/28/20 0405 01/28/20 1031 01/29/20 1057  NA 136  --  138  --  137  K 4.6   < > 3.9  --  3.6  CL 101  --  103  --  101  CO2 23  --  27  --  27  GLUCOSE 108*  --  118*  --  101*  BUN 20  --  19  --  17  CREATININE 0.84  --  0.81  --  0.86  CALCIUM 9.7  --  9.1  --  8.9  MG  --   --   --  2.1  --    < > = values in this interval not displayed.   GFR Estimated Creatinine Clearance: 88.6 mL/min (by C-G formula based on SCr of 0.86 mg/dL). Liver Function Tests: No results for input(s): AST, ALT, ALKPHOS, BILITOT, PROT, ALBUMIN in the last 168 hours. No results for input(s): LIPASE, AMYLASE in the last 168 hours. No results for input(s): AMMONIA in the last 168 hours. Coagulation profile No results for input(s): INR, PROTIME in the last 168 hours.  CBC: Recent Labs  Lab 01/27/20 1555 01/28/20 0405  WBC 11.0* 6.9  HGB 15.4 14.7  HCT 47.2 43.1  MCV 93.5 93.5  PLT 337 275   Cardiac Enzymes: No results for input(s): CKTOTAL, CKMB, CKMBINDEX, TROPONINI in the last 168 hours. BNP (last 3 results) No results for input(s): PROBNP in the last 8760 hours. CBG: No results for input(s): GLUCAP in the last 168 hours. D-Dimer: No results for input(s): DDIMER in the last 72 hours. Hgb A1c: No results for input(s): HGBA1C in the last 72 hours. Lipid Profile: No results for input(s): CHOL, HDL, LDLCALC, TRIG, CHOLHDL, LDLDIRECT in the last 72 hours. Thyroid function studies: Recent Labs    01/28/20 0405  TSH 1.728   Anemia work up: No results for input(s): VITAMINB12, FOLATE, FERRITIN, TIBC, IRON, RETICCTPCT in the last 72 hours. Sepsis Labs: Recent Labs  Lab 01/27/20 1555 01/28/20 0405  WBC 11.0* 6.9    Microbiology Recent Results (from the past 240 hour(s))  Respiratory Panel by RT PCR (Flu A&B, Covid) - Nasopharyngeal Swab     Status: None   Collection  Time: 01/27/20  9:00 PM   Specimen: Nasopharyngeal Swab  Result Value Ref Range Status   SARS Coronavirus 2 by RT PCR NEGATIVE NEGATIVE Final    Comment: (NOTE) SARS-CoV-2 target nucleic acids are NOT DETECTED.  The SARS-CoV-2 RNA is generally detectable in upper respiratoy specimens during the acute phase of infection. The lowest concentration of SARS-CoV-2 viral copies this assay can detect is 131 copies/mL. A negative result does not preclude SARS-Cov-2 infection and should not be used as the sole  basis for treatment or other patient management decisions. A negative result may occur with  improper specimen collection/handling, submission of specimen other than nasopharyngeal swab, presence of viral mutation(s) within the areas targeted by this assay, and inadequate number of viral copies (<131 copies/mL). A negative result must be combined with clinical observations, patient history, and epidemiological information. The expected result is Negative.  Fact Sheet for Patients:  https://www.moore.com/  Fact Sheet for Healthcare Providers:  https://www.young.biz/  This test is no t yet approved or cleared by the [place] FDA and  has been authorized for detection and/or diagnosis of SARS-CoV-2 by FDA under an Emergency Use Authorization (EUA). This EUA will remain  in effect (meaning this test can be used) for the duration of the COVID-19 declaration under Section 564(b)(1) of the Act, 21 U.S.C. section 360bbb-3(b)(1), unless the authorization is terminated or revoked sooner.     Influenza A by PCR NEGATIVE NEGATIVE Final   Influenza B by PCR NEGATIVE NEGATIVE Final    Comment: (NOTE) The Xpert Xpress SARS-CoV-2/FLU/RSV assay is intended as an aid in  the diagnosis of influenza from Nasopharyngeal swab specimens and  should not be used as a sole basis for treatment. Nasal washings and  aspirates are unacceptable for Xpert Xpress  SARS-CoV-2/FLU/RSV  testing.  Fact Sheet for Patients: https://www.moore.com/  Fact Sheet for Healthcare Providers: https://www.young.biz/  This test is not yet approved or cleared by the [place] FDA and  has been authorized for detection and/or diagnosis of SARS-CoV-2 by  FDA under an Emergency Use Authorization (EUA). This EUA will remain  in effect (meaning this test can be used) for the duration of the  Covid-19 declaration under Section 564(b)(1) of the Act, 21  U.S.C. section 360bbb-3(b)(1), unless the authorization is  terminated or revoked. Performed at [facility], [address] [place], [place] [address]     Procedures and diagnostic studies:  ECHOCARDIOGRAM COMPLETE  Result Date: 01/28/2020    ECHOCARDIOGRAM REPORT   Patient Name:   Dennis Mason Date of Exam: 01/28/2020 Medical Rec #:  [id number]        Height:       66.0 in Accession #:    [id number]       Weight:       154.0 lb Date of Birth:  1965/07/20         BSA:          1.790 m Patient Age:    54 years         BP:           99/79 mmHg Patient Gender: M                HR:           96 bpm. Exam Location:  Inpatient Procedure: 2D Echo, Cardiac Doppler and Color Doppler Indications:    Cardiomyopathy-Unspecified 425.9 / I42.9  History:        Patient has prior history of Echocardiogram examinations, most                 recent 05/13/2015. Arrythmias:PVC; Risk Factors:Non-Smoker.  Sonographer:    Renella Cunas RDCS Referring Phys: [id number] ANTHONY PHILIP CARNICELLI IMPRESSIONS  1. Left ventricular ejection fraction, by estimation, is 20 to 25%. The left ventricle has severely decreased function. The left ventricle demonstrates global hypokinesis. The left ventricular internal cavity size was severely dilated. Left ventricular diastolic parameters are consistent with Grade III diastolic dysfunction (restrictive). Elevated  left atrial pressure.  2. Right ventricular systolic  function is normal. The right ventricular size is normal. There is normal pulmonary artery systolic pressure. The estimated right ventricular systolic pressure is 25.1 mmHg.  3. Left atrial size was moderately dilated.  4. The mitral valve is normal in structure. Moderate mitral valve regurgitation. No evidence of mitral stenosis.  5. The aortic valve is normal in structure. Aortic valve regurgitation is trivial. No aortic stenosis is present.  6. The inferior vena cava is normal in size with greater than 50% respiratory variability, suggesting right atrial pressure of 3 mmHg. FINDINGS  Left Ventricle: Left ventricular ejection fraction, by estimation, is 20 to 25%. The left ventricle has severely decreased function. The left ventricle demonstrates global hypokinesis. The left ventricular internal cavity size was severely dilated. There is no left ventricular hypertrophy. Left ventricular diastolic parameters are consistent with Grade III diastolic dysfunction (restrictive). Elevated left atrial pressure. Right Ventricle: The right ventricular size is normal. No increase in right ventricular wall thickness. Right ventricular systolic function is normal. There is normal pulmonary artery systolic pressure. The tricuspid regurgitant velocity is 2.35 m/s, and  with an assumed right atrial pressure of 3 mmHg, the estimated right ventricular systolic pressure is 25.1 mmHg. Left Atrium: Left atrial size was moderately dilated. Right Atrium: Right atrial size was normal in size. Pericardium: There is no evidence of pericardial effusion. Mitral Valve: The mitral valve is normal in structure. Moderate mitral valve regurgitation. No evidence of mitral valve stenosis. Tricuspid Valve: The tricuspid valve is normal in structure. Tricuspid valve regurgitation is mild . No evidence of tricuspid stenosis. Aortic Valve: The aortic valve is normal in structure. Aortic valve regurgitation is trivial. No aortic stenosis is present.  Pulmonic Valve: The pulmonic valve was normal in structure. Pulmonic valve regurgitation is not visualized. No evidence of pulmonic stenosis. Aorta: The aortic root is normal in size and structure. Venous: The inferior vena cava is normal in size with greater than 50% respiratory variability, suggesting right atrial pressure of 3 mmHg. IAS/Shunts: No atrial level shunt detected by color flow Doppler.  LEFT VENTRICLE PLAX 2D LVIDd:         7.20 cm      Diastology LVIDs:         6.60 cm      LV e' medial:  4.62 cm/s LV PW:         0.80 cm      LV e' lateral: 6.08 cm/s LV IVS:        0.80 cm LVOT diam:     2.50 cm LV SV:         45 LV SV Index:   25 LVOT Area:     4.91 cm  LV Volumes (MOD) LV vol d, MOD A2C: 271.0 ml LV vol d, MOD A4C: 311.0 ml LV vol s, MOD A2C: 211.0 ml LV vol s, MOD A4C: 220.0 ml LV SV MOD A2C:     60.0 ml LV SV MOD A4C:     311.0 ml LV SV MOD BP:      76.0 ml RIGHT VENTRICLE RV S prime:     11.20 cm/s TAPSE (M-mode): 1.6 cm LEFT ATRIUM             Index       RIGHT ATRIUM           Index LA diam:        4.90 cm 2.74 cm/m  RA Area:  17.60 cm LA Vol (A2C):   87.3 ml 48.78 ml/m RA Volume:   44.30 ml  24.75 ml/m LA Vol (A4C):   54.9 ml 30.68 ml/m LA Biplane Vol: 69.6 ml 38.89 ml/m  AORTIC VALVE LVOT Vmax:   65.30 cm/s LVOT Vmean:  42.700 cm/s LVOT VTI:    0.091 m  AORTA Ao Root diam: 2.90 cm MR Peak grad:    60.8 mmHg   TRICUSPID VALVE MR Mean grad:    34.0 mmHg   TR Peak grad:   22.1 mmHg MR Vmax:         390.00 cm/s TR Vmax:        235.00 cm/s MR Vmean:        267.0 cm/s MR PISA:         1.01 cm    SHUNTS MR PISA Eff ROA: 10 mm      Systemic VTI:  0.09 m MR PISA Radius:  0.40 cm     Systemic Diam: 2.50 cm Tobias Alexander MD Electronically signed by Tobias Alexander MD Signature Date/Time: 01/28/2020/1:17:19 PM    Final     Medications:   . apixaban  5 mg Oral BID  . atorvastatin  40 mg Oral Daily  . busPIRone  10 mg Oral BID  . escitalopram  20 mg Oral Daily  . influenza vac split  quadrivalent PF  0.5 mL Intramuscular Tomorrow-1000  . ivabradine  5 mg Oral BID WC  . [START ON 01/30/2020] metoprolol succinate  12.5 mg Oral QHS  . sacubitril-valsartan  1 tablet Oral BID   Continuous Infusions:   LOS: 1 day   Joseph Art  [facility]   How to contact the [facility] Attending or Consulting provider 7A - 7P or covering provider during after hours 7P -7A, for this patient?  1. Check the care team in [facility] and look for a) attending/consulting [facility] provider listed and b) the [facility] team listed 2. Log into [email or web address] and use [place]'s universal password to access. If you do not have the password, please contact the hospital operator. 3. Locate the [facility] provider you are looking for under [facility] and page to a number that you can be directly reached. 4. If you still have difficulty reaching the provider, please page the [facility] (Director on Call) for the Hospitalists listed on amion for assistance.  01/29/2020, 5:03 PM

## 2020-01-29 NOTE — Consult Note (Signed)
[facility] Face-to-Face Psychiatry Consult   Reason for Consult: Depression and anxiety Referring Physician:  Dr. Liane Comber Patient Identification: Dennis Mason MRN:  [id number] Principal Diagnosis: PVC (premature ventricular contraction) Diagnosis:  Principal Problem:   PVC (premature ventricular contraction) Active Problems:   CHF (congestive heart failure) (HCC)   Generalized anxiety disorder   Elevated troponin   A-fib (HCC)   PVC's (premature ventricular contractions)   Total Time spent with patient: 45 minutes  Subjective:   Dennis Mason is a 54 y.o. male patient admitted with PVCs and fatigue.  Patient is alert and oriented, calm and cooperative, and very pleasant throughout the evaluation.  This Clinical research associate introduced self, and patient was very responsive and Midwife.  He remained appropriate throughout the evaluation, and set up in his bed to engage with Clinical research associate.  Patient endorses ongoing depression that appears to be situational, and secondary to bereavement.  In the past 2 years patient states his father passed away, mother passed away, his dog of 18 years, and close friend died of COVID on 28-Aug-2018.  He also reports that his girlfriend in which she has had an on and off relationship with, left and moved to [place] and is now with another man.  He reports that he is utilizing his coping skills, and is seeking outpatient therapy to include hospice twice a month for grief and loss counseling, he is also very active in his church (ex-girlfriend mother church) in which she attends twice a week.  He also reports that the church offers a psychic medium in which he is able to participate in as well.  He states he appreciates the information she has provided.  Overall patient reports good management of his depression through use of Lexapro 20 mg for 14 years, and has been managing his grief and loss very well to outpatient and hospice services.  He is adamant and continue to refuse  all suicidal ideation, suicidal thoughts, and or suicidal attempts.  He denies any previous psychiatric history with the exception of depression and anxiety noted above.  HPI:  Dennis Mason is an 54 y.o. male with history of nonischemic cardiomyopathy status post ICD placement, chronic CHF, hyperlipidemia admitted under cardiology service for evaluation of PVCs and fatigue.  Patient tells me that he moved from [place] to [place] in July 2021 as he had to take care of his mother who died on 2024/08/30due to metastatic colon cancer.  He tells me that he has been going through a lot as his girlfriend left him recently and friend died of COVID-19 infection, his dad died 2 years ago and his dog last year.  Reports generalized anxiety and depressed mood, has trouble falling, little to no interest in his favorite activities, feeling low and depressed however denies suicidal or homicidal thoughts.  He has been on Lexapro 20 mg since 14 years and has been compliant with it.  He tells me that Lexapro is helpful however he continues to have anxiety/depression symptoms.  He tried BuSpar in the past which helped and would like to be back on BuSpar.  He reports cigarette smoking three-quarter pack of cigarettes per day, drinks 8 beers per night however denies illicit drug use.  He tells me that he sees grief counselor.  he requested to see a psychiatrist while he is hospitalized.  He received Ativan last night and this morning which helped somewhat with his anxiety.   Past Psychiatric History: Depression and anxiety, has been  taking Lexapro 20 mg for over 14 years.  Risk to Self:  Denies Risk to Others:  Denies Prior Inpatient Therapy:  Denies Prior Outpatient Therapy:  No current psychiatric providers or clinicians.  He is receiving grief and loss counseling through hospice, via virtual sessions twice a month.  His general practitioner is prescribing the Lexapro.  Past Medical History:  Past Medical  History:  Diagnosis Date  . Cardiomyopathy (HCC)   . CHF (congestive heart failure) (HCC)   . Depression     Past Surgical History:  Procedure Laterality Date  . CARDIAC DEFIBRILLATOR PLACEMENT     also pacemaker  . HAND SURGERY Right    reconstruction  . ICD GENERATOR CHANGEOUT N/A 12/02/2016   Procedure: ICD Generator Changeout;  Surgeon: Regan Lemming, MD;  Location: [facility] INVASIVE CV LAB;  Service: Cardiovascular;  Laterality: N/A;   Family History:  Family History  Problem Relation Age of Onset  . Heart Problems Mother    Family Psychiatric  History: Denies Social History:  Social History   Substance and Sexual Activity  Alcohol Use No     Social History   Substance and Sexual Activity  Drug Use No    Social History   Socioeconomic History  . Marital status: Married    Spouse name: Not on file  . Number of children: Not on file  . Years of education: Not on file  . Highest education level: Not on file  Occupational History  . Not on file  Tobacco Use  . Smoking status: Never Smoker  . Smokeless tobacco: Never Used  Substance and Sexual Activity  . Alcohol use: No  . Drug use: No  . Sexual activity: Not on file  Other Topics Concern  . Not on file  Social History Narrative  . Not on file   Social Determinants of Health   Financial Resource Strain:   . Difficulty of Paying Living Expenses: Not on file  Food Insecurity:   . Worried About Programme researcher, broadcasting/film/video in the Last Year: Not on file  . Ran Out of Food in the Last Year: Not on file  Transportation Needs:   . Lack of Transportation (Medical): Not on file  . Lack of Transportation (Non-Medical): Not on file  Physical Activity:   . Days of Exercise per Week: Not on file  . Minutes of Exercise per Session: Not on file  Stress:   . Feeling of Stress : Not on file  Social Connections:   . Frequency of Communication with Friends and Family: Not on file  . Frequency of Social Gatherings with  Friends and Family: Not on file  . Attends Religious Services: Not on file  . Active Member of Clubs or Organizations: Not on file  . Attends Banker Meetings: Not on file  . Marital Status: Not on file   Additional Social History:    Allergies:   Allergies  Allergen Reactions  . Percocet [Oxycodone-Acetaminophen] Itching    Labs:  Results for orders placed or performed during the hospital encounter of 01/27/20 (from the past 48 hour(s))  Basic metabolic panel     Status: Abnormal   Collection Time: 01/27/20  3:55 PM  Result Value Ref Range   Sodium 136 135 - 145 mmol/L   Potassium 4.6 3.5 - 5.1 mmol/L   Chloride 101 98 - 111 mmol/L   CO2 23 22 - 32 mmol/L   Glucose, Bld 108 (H) 70 - 99 mg/dL  Comment: Glucose reference range applies only to samples taken after fasting for at least 8 hours.   BUN 20 6 - 20 mg/dL   Creatinine, Ser 6.94 0.61 - 1.24 mg/dL   Calcium 9.7 8.9 - 85.4 mg/dL   GFR calc non Af Amer >60 >60 mL/min   GFR calc Af Amer >60 >60 mL/min   Anion gap 12 5 - 15    Comment: Performed at [facility], [address], [place], [place] [address]  CBC     Status: Abnormal   Collection Time: 01/27/20  3:55 PM  Result Value Ref Range   WBC 11.0 (H) 4.0 - 10.5 K/uL   RBC 5.05 4.22 - 5.81 MIL/uL   Hemoglobin 15.4 13.0 - 17.0 g/dL   HCT 50.0 39 - 52 %   MCV 93.5 80.0 - 100.0 fL   MCH 30.5 26.0 - 34.0 pg   MCHC 32.6 30.0 - 36.0 g/dL   RDW 93.8 18.2 - 99.3 %   Platelets 337 150 - 400 K/uL   nRBC 0.0 0.0 - 0.2 %    Comment: Performed at [facility], [address], [place], [place] [address]  Troponin I (High Sensitivity)     Status: Abnormal   Collection Time: 01/27/20  3:55 PM  Result Value Ref Range   Troponin I (High Sensitivity) 61 (H) <18 ng/L    Comment: (NOTE) Elevated high sensitivity troponin I (hsTnI) values and significant  changes across serial measurements may suggest ACS but many other  chronic and acute conditions are  known to elevate hsTnI results.  Refer to the "Links" section for chest pain algorithms and additional  guidance. Performed at [facility], [address] [place], [place] [address]   Digoxin level     Status: Abnormal   Collection Time: 01/27/20  3:55 PM  Result Value Ref Range   Digoxin Level 2.3 (HH) 1.0 - 2.0 ng/mL    Comment: Please note change in reference range. CRITICAL RESULT CALLED TO, READ BACK BY AND VERIFIED WITH: RN L VENEGAS AT 1759 01/27/20 BY L BENFIELD Performed at [facility], [address], [place], [place] [address]   Brain natriuretic peptide     Status: Abnormal   Collection Time: 01/27/20  3:55 PM  Result Value Ref Range   B Natriuretic Peptide 991.7 (H) 0.0 - 100.0 pg/mL    Comment: Performed at [facility], [address] [place], [place] [address]  Troponin I (High Sensitivity)     Status: Abnormal   Collection Time: 01/27/20  6:34 PM  Result Value Ref Range   Troponin I (High Sensitivity) 73 (H) <18 ng/L    Comment: (NOTE) Elevated high sensitivity troponin I (hsTnI) values and significant  changes across serial measurements may suggest ACS but many other  chronic and acute conditions are known to elevate hsTnI results.  Refer to the "Links" section for chest pain algorithms and additional  guidance. Performed at [facility], [address], [place], [place] [address]   Respiratory Panel by RT PCR (Flu A&B, Covid) - Nasopharyngeal Swab     Status: None   Collection Time: 01/27/20  9:00 PM   Specimen: Nasopharyngeal Swab  Result Value Ref Range   SARS Coronavirus 2 by RT PCR NEGATIVE NEGATIVE    Comment: (NOTE) SARS-CoV-2 target nucleic acids are NOT DETECTED.  The SARS-CoV-2 RNA is generally detectable in upper respiratoy specimens during the acute phase  of infection. The lowest concentration of SARS-CoV-2 viral copies this assay can detect is 131 copies/mL. A negative result does not preclude SARS-Cov-2 infection  and should not be used as the sole basis for treatment or other patient management decisions. A negative result may occur with  improper specimen collection/handling, submission of specimen other than nasopharyngeal swab, presence of viral mutation(s) within the areas targeted by this assay, and inadequate number of viral copies (<131 copies/mL). A negative result must be combined with clinical observations, patient history, and epidemiological information. The expected result is Negative.  Fact Sheet for Patients:  https://www.moore.com/  Fact Sheet for Healthcare Providers:  https://www.young.biz/  This test is no t yet approved or cleared by the [place] FDA and  has been authorized for detection and/or diagnosis of SARS-CoV-2 by FDA under an Emergency Use Authorization (EUA). This EUA will remain  in effect (meaning this test can be used) for the duration of the COVID-19 declaration under Section 564(b)(1) of the Act, 21 U.S.C. section 360bbb-3(b)(1), unless the authorization is terminated or revoked sooner.     Influenza A by PCR NEGATIVE NEGATIVE   Influenza B by PCR NEGATIVE NEGATIVE    Comment: (NOTE) The Xpert Xpress SARS-CoV-2/FLU/RSV assay is intended as an aid in  the diagnosis of influenza from Nasopharyngeal swab specimens and  should not be used as a sole basis for treatment. Nasal washings and  aspirates are unacceptable for Xpert Xpress SARS-CoV-2/FLU/RSV  testing.  Fact Sheet for Patients: https://www.moore.com/  Fact Sheet for Healthcare Providers: https://www.young.biz/  This test is not yet approved or cleared by the [place] FDA and  has been authorized for detection and/or diagnosis of SARS-CoV-2 by  FDA under an Emergency Use Authorization (EUA). This EUA will remain  in effect (meaning this test can be used) for the duration of the  Covid-19 declaration under  Section 564(b)(1) of the Act, 21  U.S.C. section 360bbb-3(b)(1), unless the authorization is  terminated or revoked. Performed at [facility], [address], [place], [place] [address]   Basic metabolic panel     Status: Abnormal   Collection Time: 01/28/20  4:05 AM  Result Value Ref Range   Sodium 138 135 - 145 mmol/L   Potassium 3.9 3.5 - 5.1 mmol/L   Chloride 103 98 - 111 mmol/L   CO2 27 22 - 32 mmol/L   Glucose, Bld 118 (H) 70 - 99 mg/dL    Comment: Glucose reference range applies only to samples taken after fasting for at least 8 hours.   BUN 19 6 - 20 mg/dL   Creatinine, Ser 2.84 0.61 - 1.24 mg/dL   Calcium 9.1 8.9 - 13.2 mg/dL   GFR calc non Af Amer >60 >60 mL/min   GFR calc Af Amer >60 >60 mL/min   Anion gap 8 5 - 15    Comment: Performed at [facility], [address], [place], [place] [address]  CBC     Status: None   Collection Time: 01/28/20  4:05 AM  Result Value Ref Range   WBC 6.9 4.0 - 10.5 K/uL   RBC 4.61 4.22 - 5.81 MIL/uL   Hemoglobin 14.7 13.0 - 17.0 g/dL   HCT 27.2 39 - 52 %   MCV 93.5 80.0 - 100.0 fL   MCH 31.9 26.0 - 34.0 pg   MCHC 34.1 30.0 - 36.0 g/dL   RDW 53.6 64.4 - 03.4 %   Platelets 275 150 - 400 K/uL  nRBC 0.0 0.0 - 0.2 %    Comment: Performed at [facility], [address] [place], [place] [address]  TSH     Status: None   Collection Time: 01/28/20  4:05 AM  Result Value Ref Range   TSH 1.728 0.350 - 4.500 uIU/mL    Comment: Performed by a 3rd Generation assay with a functional sensitivity of <=0.01 uIU/mL. Performed at [facility], [address] [place], [place] [address]   Digoxin level     Status: Abnormal   Collection Time: 01/28/20 10:31 AM  Result Value Ref Range   Digoxin Level 0.9 (L) 1.0 - 2.0 ng/mL    Comment: Please note change in reference range. Performed at [facility], [address], [place], [place] [address]   Magnesium     Status: None   Collection Time: 01/28/20 10:31 AM   Result Value Ref Range   Magnesium 2.1 1.7 - 2.4 mg/dL    Comment: Performed at [facility], [address] [place], [place] [address]    Current Facility-Administered Medications  Medication Dose Route Frequency Provider Last Rate Last Admin  . apixaban (ELIQUIS) tablet 5 mg  5 mg Oral BID Rosario Jacks, MD   5 mg at 01/28/20 2210  . atorvastatin (LIPITOR) tablet 40 mg  40 mg Oral Daily Rosario Jacks, MD   40 mg at 01/28/20 0841  . busPIRone (BUSPAR) tablet 10 mg  10 mg Oral BID Pahwani, Rinka R, MD   10 mg at 01/28/20 1718  . escitalopram (LEXAPRO) tablet 20 mg  20 mg Oral Daily Rosario Jacks, MD   20 mg at 01/28/20 0841  . influenza vac split quadrivalent PF (FLUARIX) injection 0.5 mL  0.5 mL Intramuscular Tomorrow-1000 Carnicelli, Felipe Drone, MD      . ivabradine [facility]) tablet 5 mg  5 mg Oral BID WC Duke Salvia, MD   5 mg at 01/29/20 0735  . metoprolol succinate (TOPROL-XL) 24 hr tablet 25 mg  25 mg Oral Daily Sheilah Pigeon, PA-C      . ondansetron [facility]) injection 4 mg  4 mg Intravenous Q6H PRN Rosario Jacks, MD      . sacubitril-valsartan (ENTRESTO) 24-26 mg per tablet  1 tablet Oral BID Sheilah Pigeon, PA-C      . zolpidem [facility]) tablet 10 mg  10 mg Oral QHS PRN Rosario Jacks, MD   10 mg at 01/28/20 2207    Musculoskeletal: Strength & Muscle Tone: within normal limits Gait & Station: normal Patient leans: N/A  Psychiatric Specialty Exam: Physical Exam Vitals and nursing note reviewed.  Constitutional:      Appearance: Normal appearance.  HENT:     Head: Normocephalic.  Neurological:     General: No focal deficit present.     Mental Status: He is alert and oriented to person, place, and time.  Psychiatric:        Mood and Affect: Mood normal.        Behavior: Behavior normal.        Thought Content: Thought content normal.        Judgment: Judgment normal.     Review of  Systems  Psychiatric/Behavioral: Positive for dysphoric mood (Depression).  All other systems reviewed and are negative.   Blood pressure (!) 83/67, pulse 89, temperature 98.7 F (37.1 C), temperature source Oral, resp. rate 16, height 5\' 6"  (1.676 m), weight 66.5 kg, SpO2  94 %.Body mass index is 23.66 kg/m.  General Appearance: Meticulous, Well Groomed and Purple hair and white glasses  Eye Contact:  Good  Speech:  Clear and Coherent and Normal Rate  Volume:  Normal  Mood:  Anxious and Depressed  Affect:  Appropriate and Congruent  Thought Process:  Coherent, Goal Directed and Descriptions of Associations: Circumstantial  Orientation:  Full (Time, Place, and Person)  Thought Content:  Logical  Suicidal Thoughts:  No  Homicidal Thoughts:  No  Memory:  Immediate;   Good Recent;   Good  Judgement:  Good  Insight:  Good  Psychomotor Activity:  Normal  Concentration:  Concentration: Good and Attention Span: Good  Recall:  Good  Fund of Knowledge:  Good  Language:  Good  Akathisia:  No  Handed:  Right  AIMS (if indicated):     Assets:  Communication Skills Desire for Improvement Financial Resources/Insurance Housing Leisure Time Physical Health Social Support  ADL's:  Intact  Cognition:  WNL  Sleep:        Treatment Plan Summary: Plan We will continue Lexapro 20 mg p.o. daily for depression and anxiety.  Previous trial of buspirone was tolerated well, therefore this medication was also started.  BuSpar 10 mg p.o. twice daily for anxiety.  Patient states he plans to return home to [place], and plans to resume services at the [facility].  So at this time he is declining any outpatient resources in this area.  Patient has also declined speaking with the chaplain during his hospital admission.  He is made aware that this service is available to him if he changes his mind.  He currently is denying all suicidal ideations, homicidal ideations.  Disposition: No evidence of  imminent risk to self or others at present.   Patient does not meet criteria for psychiatric inpatient admission. Discussed crisis plan, support from social network, calling 911, coming to the Emergency Department, and calling Suicide Hotline.  Maryagnes Amos, FNP 01/29/2020 10:59 AM

## 2020-01-29 NOTE — Progress Notes (Signed)
Progress Note  Patient Name: Dennis Mason [facility] Date of Encounter: 01/29/2020  [facility] Cardiologist: Dr. Susa Loffler in [place], [place]  Subjective   Unable to sleep, quite anxious still, breathing easier though and physically says he feels a bit better, more "settled"  Inpatient Medications    Scheduled Meds: . apixaban  5 mg Oral BID  . atorvastatin  40 mg Oral Daily  . busPIRone  10 mg Oral BID  . escitalopram  20 mg Oral Daily  . influenza vac split quadrivalent PF  0.5 mL Intramuscular Tomorrow-1000  . ivabradine  5 mg Oral BID WC  . metoprolol succinate  25 mg Oral Daily  . sacubitril-valsartan  1 tablet Oral BID   Continuous Infusions:  PRN Meds: ondansetron (ZOFRAN) IV, zolpidem   Vital Signs    Vitals:   01/28/20 1600 01/28/20 1705 01/29/20 0039 01/29/20 0329  BP:  90/72 (!) 132/98 130/65  Pulse:  88  83  Resp:  17 16   Temp:  98.3 F (36.8 C) 98.7 F (37.1 C)   TempSrc:  Oral Oral   SpO2:  99% 99%   Weight: 66.5 kg     Height: 5\' 6"  (1.676 m)       Intake/Output Summary (Last 24 hours) at 01/29/2020 0801 Last data filed at 01/29/2020 0800 Gross per 24 hour  Intake 125 ml  Output --  Net 125 ml   Last 3 Weights 01/28/2020 12/04/2016 12/02/2016  Weight (lbs) 146 lb 9.6 oz 154 lb 154 lb  Weight (kg) 66.497 kg 69.854 kg 69.854 kg      Telemetry    SR, avg HR has improved from 100's down to 80''s-90's, c/w frequent PVCs, couplets,  NSVT once 7 beats - Personally Reviewed  ECG    No new EKGs - Personally Reviewed  Physical Exam   GEN: No acute distress.   Neck: No JVD today Cardiac: RRR, extrasystoles, no murmurs, rubs, or gallops.  Respiratory: CTA b/l GI: Soft, nontender, non-distended  MS: No edema; No deformity. Neuro:  Nonfocal  Psych: Normal affect   Labs    High Sensitivity Troponin:   Recent Labs  Lab 01/27/20 1555 01/27/20 1834  TROPONINIHS 61* 73*      Chemistry Recent Labs  Lab 01/27/20 1555 01/28/20 0405  NA 136  138  K 4.6 3.9  CL 101 103  CO2 23 27  GLUCOSE 108* 118*  BUN 20 19  CREATININE 0.84 0.81  CALCIUM 9.7 9.1  GFRNONAA >60 >60  GFRAA >60 >60  ANIONGAP 12 8     Hematology Recent Labs  Lab 01/27/20 1555 01/28/20 0405  WBC 11.0* 6.9  RBC 5.05 4.61  HGB 15.4 14.7  HCT 47.2 43.1  MCV 93.5 93.5  MCH 30.5 31.9  MCHC 32.6 34.1  RDW 13.7 14.0  PLT 337 275    BNP Recent Labs  Lab 01/27/20 1555  BNP 991.7*     DDimer No results for input(s): DDIMER in the last 168 hours.   Radiology    DG Chest 2 View  Result Date: 01/27/2020 CLINICAL DATA:  Shortness of breath and chest pain EXAM: CHEST - 2 VIEW COMPARISON:  12/04/2016 FINDINGS: Cardiac shadow is at the upper limits of normal in size. The lungs are well aerated bilaterally. No focal infiltrate or sizable effusion is seen. Defibrillator is again noted and stable. IMPRESSION: No acute abnormality noted. Electronically Signed   By: 02/03/2017 M.D.   On: 01/27/2020 16:43  Cardiac Studies    01/28/2020: TTE IMPRESSIONS  1. Left ventricular ejection fraction, by estimation, is 20 to 25%. The  left ventricle has severely decreased function. The left ventricle  demonstrates global hypokinesis. The left ventricular internal cavity size  was severely dilated. Left ventricular  diastolic parameters are consistent with Grade III diastolic dysfunction  (restrictive). Elevated left atrial pressure.  2. Right ventricular systolic function is normal. The right ventricular  size is normal. There is normal pulmonary artery systolic pressure. The  estimated right ventricular systolic pressure is 25.1 mmHg.  3. Left atrial size was moderately dilated.  4. The mitral valve is normal in structure. Moderate mitral valve  regurgitation. No evidence of mitral stenosis.  5. The aortic valve is normal in structure. Aortic valve regurgitation is  trivial. No aortic stenosis is present.  6. The inferior vena cava is normal in size  with greater than 50%  respiratory variability, suggesting right atrial pressure of 3 mmHg.   Echo 2017 The left ventricle is normal in size.  There is normal left ventricular wall thickness.  Mildly reduced LVEF estimated at 45%. There is apical and global hypokinesis and dyssynchrony with pacing leads in the  right heart.  The right ventricle is normal in size, thickness and function.  The left atrialsize is normal.  The right atrium is normal in size.  The mitral valve leaflets appear normal. There is no evidence of stenosis, fluttering, or prolapse.  The aortic valve is normal in structure and function.  There is mild tricuspid regurgitation.  Right ventricular systolic pressure is estimated to be 24 mmHg.  The aortic root is normal size.  There is no pericardial effusion.    Patient Profile     54 y.o. male NICM, ICD that originally was implanted 2009 for primary preventionand underwent gen change while here visiting family by Dr. Elberta Fortis in 2018. Known to have frequent PVCs w/history of an ablation 2013, AFib (described as paroxysmal), HLD, chronic CHF (last known LVEF in 2017 45%) though the pt reports a more recent echo done early this year with LVEF in the 30's  Assessment & Plan    1. Palpitations     Less noted by the patient, "seems a little better settled" 2. PAFib  3. PVCs, NSVT 4. SVT in VT (monitored) zone, not true VT  5. AFib     CHA2DS2Vasc is one, maintained on Eliquis AF episodes EGM that are reviewed are SR with PVCs, no true AF Burden not likely as high as device reports  5. HS trop abnormal     He denies any CP     Not felt need for ischemic eval  6. Chronic CHF (systolic)     s/p IV lasix yesterday, pt reports brisk urine out put     Exam is improved     PO lasix today     BMET ordered but not yet done     EF down some, perhaps 2/2 PVCs/NSVT, ETOH abuse  He reports having quit drinking Urged to quit smoking  Resumed on his Entresto  here, started on Ibabradine, BP tolerating   7. Anxiety     Appreciate medicine team help, no psychiatry note yet     Started on buspar, more ativan this AM     Will defer to IM team ongoing management of this   Dr. Graciela Husbands has seen and examined the patient this AM Anticipate discharge tomorrow from EP/rhuythm standpoint, if medicine team also feels ready  For questions or updates, please contact [facility] Please consult [email or web address] for contact info under        Signed, Sheilah Pigeon, PA-C  01/29/2020, 8:01 AM

## 2020-01-29 NOTE — Progress Notes (Signed)
Nurse tech notified nurse of BP of 157/126 on Dinamap. Provider stated to perform manual BP due to pt very irregular heart rate can cause false reading on machine. Manual BP WNL, entered in flowsheets.

## 2020-01-29 NOTE — Progress Notes (Addendum)
Notified by nursing of auto cuff BP 70's systolic abd manual BP 80's No change reported on telemetry Reported patient warm, dry, asymptomatic, pt reported feeling "fine" not lethargic, told nurse that the same thing happened yesterday after ativan.  No further doses noted/scheduled Ordered to hold this AM metoprolol and Entresto, discontinued lasix and gave order for IVF to be given  I have placed a call out to Dr. Graciela Husbands.   Addend Patient seen, is in NAD, feels well, in fact states he was relieved to have his BP back down to where he is used to it being Not dizzy, lightheaded, not SOB, skin is warm and dry Manual BP myself is 94/60 No fluids (d/w RN) will hold off   Addend Revisited patient, eating lunch, feeling well.  Smiling and looking forward to going home, both Manual BP by RN 92/58 now, OK for his metoprolol. Will hold off on Entresto this AM No chair in his room.  Asked RN to look into getting him a chair and OOB Discussed with Dr. Graciela Husbands, will reduce his toprol to 12.5mg  at [facility]  BMET has now been drawn, await results  Francis Dowse , PA-C

## 2020-01-29 NOTE — Discharge Instructions (Signed)

## 2020-01-29 NOTE — Progress Notes (Signed)
Yellow MEWS   Pt BP 72/69 and  HR 89. Pt asymptomatic. Alert and oriented x4. Providers paged and made aware. Yellow MEWS implemented. Vitals rechecked manually per Dr. Benjamine Mola orders.

## 2020-01-30 MED ORDER — BUSPIRONE HCL 10 MG PO TABS: 10.0000 mg | ORAL_TABLET | Freq: Two times a day (BID) | ORAL | 0 refills | Status: AC

## 2020-01-30 MED ORDER — METOPROLOL SUCCINATE ER 25 MG PO TB24: 12.5000 mg | ORAL_TABLET | Freq: Every day | ORAL | 0 refills | Status: AC

## 2020-01-30 MED ORDER — ENTRESTO 24-26 MG PO TABS: 1.0000 | ORAL_TABLET | Freq: Two times a day (BID) | ORAL | 0 refills | Status: AC

## 2020-01-30 MED ORDER — IVABRADINE HCL 5 MG PO TABS: 5.0000 mg | ORAL_TABLET | Freq: Two times a day (BID) | ORAL | 0 refills | Status: AC

## 2020-01-30 MED FILL — METOPROLOL SUCCINATE ER 25: 25 | 30 days supply | Qty: 15 | Fill #0

## 2020-01-30 MED FILL — busPIRone HCL 10 MG TABS: 10 | 30 days supply | Qty: 60 | Fill #0

## 2020-01-30 MED FILL — ENTRESTO 24 MG-26 MG TABLET: 24-26 | 30 days supply | Qty: 60 | Fill #0

## 2020-01-30 NOTE — Discharge Summary (Signed)
DISCHARGE SUMMARY    Patient ID: Dennis Mason,  MRN: [id number], DOB/AGE: 06/26/1965 54 y.o.  Admit date: 01/27/2020 Discharge date: 01/30/2020  Primary Care Physician: Pcp, No  Primary Cardiologist/Electrophysiologist: Dr. Susa Loffler in [place], [place]  Primary Discharge Diagnosis:  1. PVCs, NSVT 2.  3. SVT 4. Depression, anxiety  Secondary Discharge Diagnosis:  1. AFib, paroxysmal     CHA2DS2Vasc is one, on Eliquis, appropriately dosed   Allergies  Allergen Reactions   Percocet [Oxycodone-Acetaminophen] Itching     Procedures This Admission:  none  Brief HPI: Dennis Mason is a 54 y.o. male w/PMHx of NICM, ICD that originally was implanted 2009 for primary preventionand underwent gen change while here visiting family by Dr. Elberta Fortis in 2018. Known to have frequent PVCs w/history of an ablation 2013, AFib (described as paroxysmal), HLD, chronic CHF (last known LVEF in 2017 45%) though the pt reports a more recent echo done early this year with LVEF in the 30's saught attention for increased palpitations and generally not feeling well, unusually winded/SOB taking .  Device check reported several AFib episodes as well as tachycardias in VT monitor sone lasting several minutes.  With frequent PVCs, NSVT on telemetry was admitted to cardiology.   Hospital Course:  The patient was admitted, labs largeyly unremarkable, mild abn HS Trop without symptoms of CP, not felt to be ACS and likely demand 2/2 his tachycardia.  EP consulted, reports that in July he really had to drop everything to come and care for his Mom, she has passed away and now trying to deal with her estate.  He is an only child and this has been extraordinarily stressful for him, missed her a great deal, and mentions his girlfriend (of years) left him for another person during all of this and quite upsetting as well. He has been without his Entresto since here. He was visibly anxious, and reported the same  with some feelings of being overwhelmed will all of his recent life events.  Medicine team was consulted to aid in this as well as psychiatry.  He reported ongoing smoking, was counseled, and drinking as much as 8 beers nightly though felt he could easily quit this.  He reported having Buspar previously though not here with him, and had missed a week of his lexapro but had been back in that or a few weeks prior to coming in.  He was placed on Buspar here by medicine, and psychiatry felt he was not a risk, and did not need in patient therapy.  Both services not he was actively engaged on support groups in [place] and virtually while here.  Cardiac-wise, he was felt to be volume OL by exam and given a dose of IV lasix, his device interrogation noted NSVT episodes, EGMs reviewed of his labeled monitored VT are felt to be SVT by morphology.  His AFib EGMs reviewed were SR with PVCs, not AFib.  He had not gotten any therapies He was started on his home dose Entresto, maintained on his metoprolol, and started on Ivabradine, noting at his baseline his HR generally 100's.  He had some lower BP readings though asymptomatic and noted on out patient visits in care everywhere and patient reported that his usual BP's run 90's or so.  His metoprolol dose reduced to allow his entresto  His average HR did improve to generally 90's, VC and NSVT burden about the same, he has not had any sustained arrhythmias, no SVT or  AFib here, and his symptoms much improved.  His telemetry reviewed by Dr.Klein  Dr. Graciela Husbands has instructed him not to drive until cleared to by his attending cardiologist.  TTE done here noted LVEF 20-25% (patient reports his last known EF 30% earlier this year, we do not have this for review), grade III DD Sherryll Burger will help with diuresis, and he has at baseline PRN lasix/K+  He has plans to get back to [place], in the next week-2 weeks, he will  follow up with his cardiology team there who he was  in the middle of being evaluated with plans towards repeat PVC ablation.  Dr. Graciela Husbands was in communication with Dr. Glean Hess and discussed medicines and plans, he was in agreement.   The patient feels well, he has ambulated in the hall without difficulty, denies any CP, mentiones only a very mild SOB, "night and day better".  He was examined by Dr. Graciela Husbands and considered stable for discharge to home.   The patient requires prior-auth for Corlanor, his copay will be $196 which he is OK with.  I have started the process and [employer] will fax the PA to the office.  I discussed with the patient that this may take a week or longer.  Given that we have provided him with 3 weeks of samples to get him back to [place] where his cardiologist will pick upthe process of prior-auth and prescribing going forward. (not filled by TOC today) He understands that we are providing only one-time Rx for his Sherryll Burger (that he is out of), the new metoprolol dose and the Buspar and that he will need to follow up with his physicians in [place] going forward, though he will likely be here for another week-10 days and needs a fill to get him going and back home.  He reports that he has enough of his other medicines.  We discussed sodium restriction, fluid restriction and PRN use of his lasix and K+ as he has in the past with any swelling/SOB.  We discussed no ETOH going forward, he is feeling well, and feels like this will not be an issue for him    Physical Exam: Vitals:   01/30/20 0039 01/30/20 0649 01/30/20 0921 01/30/20 1235  BP: 100/65 100/85 100/60 (!) 112/50  Pulse: 86 (!) 59 98 88  Resp: 15 15  16   Temp: 97.9 F (36.6 C) 98.4 F (36.9 C)  98.9 F (37.2 C)  TempSrc: Oral Oral  Oral  SpO2: 99% 96%  95%  Weight:      Height:        GEN- The patient is well appearing, alert and oriented x 3 today.   HEENT: normocephalic, atraumatic; sclera clear, conjunctiva pink; hearing intact; oropharynx clear Lungs-  CTA b/l, normal work of breathing.  No wheezes, rales, rhonchi Heart- RRR, extrasystoles noted, no murmurs, rubs or gallops, PMI not laterally displaced GI- soft, non-tender, non-distended Extremities- no clubbing, cyanosis, or edema MS- no significant deformity or atrophy Skin- warm and dry, no rash or lesion,  Psych- euthymic mood, full affect Neuro- no gross defecits  Labs:   Lab Results  Component Value Date   WBC 6.9 01/28/2020   HGB 14.7 01/28/2020   HCT 43.1 01/28/2020   MCV 93.5 01/28/2020   PLT 275 01/28/2020    Recent Labs  Lab 01/29/20 1057  NA 137  K 3.6  CL 101  CO2 27  BUN 17  CREATININE 0.86  CALCIUM 8.9  GLUCOSE 101*  Discharge Medications:  Allergies as of 01/30/2020      Reactions   Percocet [oxycodone-acetaminophen] Itching      Medication List    TAKE these medications   busPIRone 10 MG tablet Commonly known as: BUSPAR Take 1 tablet (10 mg total) by mouth 2 (two) times daily.   digoxin 0.25 MG tablet Commonly known as: LANOXIN Take 250 mcg by mouth daily.   Eliquis 5 MG Tabs tablet Generic drug: apixaban Take 5 mg by mouth in the morning and at bedtime.   Entresto 24-26 MG Generic drug: sacubitril-valsartan Take 1 tablet by mouth 2 (two) times daily.   escitalopram 20 MG tablet Commonly known as: LEXAPRO Take 20 mg by mouth daily.   furosemide 40 MG tablet Commonly known as: LASIX Take 40 mg by mouth daily as needed for fluid.   ivabradine 5 MG Tabs tablet Commonly known as: CORLANOR Take 1 tablet (5 mg total) by mouth 2 (two) times daily with a meal.   Klor-Con M10 10 MEQ tablet Generic drug: potassium chloride Take 10 mEq by mouth daily as needed (when taking water pill).   metoprolol succinate 25 MG 24 hr tablet Commonly known as: TOPROL-XL Take 0.5 tablets (12.5 mg total) by mouth at bedtime. What changed:   how much to take  when to take this   tadalafil 20 MG tablet Commonly known as: CIALIS Take 1 tablet  by mouth daily as needed for erectile dysfunction.   zolpidem 10 MG tablet Commonly known as: AMBIEN Take 10 mg by mouth at bedtime as needed for sleep.       Disposition:  Discharge Instructions    Diet - low sodium heart healthy   Complete by: As directed    Increase activity slowly   Complete by: As directed       Follow-up Information    Duke Salvia, MD Follow up.   Specialty: Cardiology Why: Cardiologist/electrophysiologist who saw you here Contact information: [address] [place] [address] [phone]               Duration of Discharge Encounter: Greater than 30 minutes including physician time.  Norma Fredrickson, PA-C 01/30/2020 2:57 PM

## 2020-01-30 NOTE — TOC Benefit Eligibility Note (Signed)
Transition of Care [facility] Benefit Eligibility Note    Patient Details  Name: Dennis Mason MRN: [id number] Date of Birth: 07/07/1965   Medication/Dose: Retia Passe TABET 5 MG BID  Covered?: Yes  Tier:  [facility])  Prescription Coverage Preferred Pharmacy: [facility] with Person/Company/Phone Number:: [facility] [place] # [phone]  Co-Pay: $498.26  Prior Approval: Yes (# [phone])          [name] Phone Number: 01/30/2020, 1:08 PM

## 2020-01-30 NOTE — Care Management (Addendum)
Entered benefit check for Corlanor.  Ronny Flurry

## 2020-01-30 NOTE — Progress Notes (Signed)
Nsg Discharge Note  Admit Date:  01/27/2020 Discharge date: 01/30/2020   Dennis Mason [facility] to be D/C'd Home per MD order.  AVS completed.  Copy for chart, and copy for patient signed, and dated. Patient/caregiver able to verbalize understanding.  Discharge Medication: Allergies as of 01/30/2020      Reactions   Percocet [oxycodone-acetaminophen] Itching      Medication List    TAKE these medications   busPIRone 10 MG tablet Commonly known as: BUSPAR Take 1 tablet (10 mg total) by mouth 2 (two) times daily.   digoxin 0.25 MG tablet Commonly known as: LANOXIN Take 250 mcg by mouth daily.   Eliquis 5 MG Tabs tablet Generic drug: apixaban Take 5 mg by mouth in the morning and at bedtime.   Entresto 24-26 MG Generic drug: sacubitril-valsartan Take 1 tablet by mouth 2 (two) times daily.   escitalopram 20 MG tablet Commonly known as: LEXAPRO Take 20 mg by mouth daily.   furosemide 40 MG tablet Commonly known as: LASIX Take 40 mg by mouth daily as needed for fluid.   ivabradine 5 MG Tabs tablet Commonly known as: CORLANOR Take 1 tablet (5 mg total) by mouth 2 (two) times daily with a meal.   Klor-Con M10 10 MEQ tablet Generic drug: potassium chloride Take 10 mEq by mouth daily as needed (when taking water pill).   metoprolol succinate 25 MG 24 hr tablet Commonly known as: TOPROL-XL Take 0.5 tablets (12.5 mg total) by mouth at bedtime. What changed:   how much to take  when to take this   tadalafil 20 MG tablet Commonly known as: CIALIS Take 1 tablet by mouth daily as needed for erectile dysfunction.   zolpidem 10 MG tablet Commonly known as: AMBIEN Take 10 mg by mouth at bedtime as needed for sleep.       Discharge Assessment: Vitals:   01/30/20 0921 01/30/20 1235  BP: 100/60 (!) 112/50  Pulse: 98 88  Resp:  16  Temp:  98.9 F (37.2 C)  SpO2:  95%   Skin clean, dry and intact without evidence of skin break down, no evidence of skin tears noted. IV  catheter discontinued intact. Site without signs and symptoms of complications - no redness or edema noted at insertion site, patient denies c/o pain - only slight tenderness at site.  Dressing with slight pressure applied.  D/c Instructions-Education: Discharge instructions given to patient/family with verbalized understanding. D/c education completed with patient/family including follow up instructions, medication list, d/c activities limitations if indicated, with other d/c instructions as indicated by MD - patient able to verbalize understanding, all questions fully answered. Patient instructed to return to ED, call 911, or call MD for any changes in condition.  Patient escorted via WC, and D/C home via private auto.  Boykin Nearing, RN 01/30/2020 3:36 PM

## 2020-04-15 ENCOUNTER — Ambulatory Visit: Payer: PRIVATE HEALTH INSURANCE | Attending: Family

## 2020-04-15 DIAGNOSIS — Z95810 Presence of automatic (implantable) cardiac defibrillator: Secondary | ICD-10-CM

## 2020-04-15 MED ADMIN — GADOBUTROL 1 MMOL/ML IV SOLN: 11 mL | INTRAVENOUS | @ 19:00:00 | Stop: 2020-04-15 | NDC 50419032511

## 2020-04-15 NOTE — Procedures
Radiology Evaluation Pre- & Post-MRI for Patients with Implanted Cardiac Devices     Supervising Attending Radiologist:  Porfirio Mylar, MD [phone])    Pre-MRI Screening   Type of Device:  ICD      MR Conditional:    Yes      Device manufacture / model / serial number / implant date:    Generator:   Medtronic / Visia AF MRI VR [id number] / [id number] / implanted 12/02/2016    RV Lead:  Medtronic / [id number] cm Sprint Quattro Secure / [id number] / implanted 06/01/2007     Indication for implant:  Frequent PVCs, VT; cardiomyopathy; paroxsymal atrial fibrillation       Presence of epicardial or abandoned leads:   No     Pacemaker dependent:    No     Imaging Requested:  Cardiac MRI wo+w contrast     Reason for exam:  Frequent PVCs (premature ventricular contraction)     Cleared by Sherald Barge, MD (Cardiac Electrophysiology)    Discussion  The option for obtaining a MRI in the setting of an implanted cardiac device was discussed in detail with the patient and all questions were answered.  This is not a standard procedure but has been performed safely at [facility] on a case by case basis when the required clinical information from MRI exceeds the risk associated with the scan.  MRI scan is indicated if no other imaging modality can provide the essential clinical information needed for care.  Potential risks include, but are not limited to malfunction or damage to generator and/or lead(s) leading to revision or replacement of existing device system, heart injury, and arrhythmias at the time of the scan with inability to provide immediate ATP or ICD shocks.  It was emphasized, however that no guarantee can be given that other unforeseen complications can occur. The implanted cardiac device is interrogated and programmed per protocol before and after the MRI.  The patient is monitored during the scan.  The patient fully understood the procedure, risks, benefits and alternatives and wished to proceed with the scan as requested by their referring physician.  Written informed consent signed by the patient.    Pre MRI Device Parameters   Mode   VVI Lower 40     Battery voltage 7.5 years / 2.99 V      Impedance        RV   456 ohms    RV/SVC defib 39 / 58 ohms     Sensing    R wave  4.5 mV     Pacing thresholds    RV     3.00 V @ 1.00 ms      Underlying rhythm Appears to be atrial fibrillation with PVCs, ventricular rate 70 - 100 bpm      Pacing Percentage (since 02/23/2020)    V paced 0%      Tachy Episodes      VT/VF:  0    VT-NS:  6    AF:  238          AT/AF burden:  6%.  Per patient, medications include eliquis and digoxin.       Pre MRI Programming    Pacing mode programmed to Medtronic MRI protection mode ON --> OVO   Detection/therapies programmed off (disabled).        Intra-MRI Monitoring   Time started:            0905   Time completed:  1030      Telemetry monitoring:  Atrial fibrillation with PVCs, ventricular rate at 70 - 90 bpm       At 1020, converted to sinus rhythm with PVCs at 80 - 90 bpm   Blood pressure:    85 - 98 / 50 - 60   O2 saturations :     92 - 96%, room air       Patient reported no symptoms such as heating or pulling at device site.    Post MRI Programming and Device Parameters   Reprogrammed to all initial pre-MRI pacing parameters.     All initial ICD monitoring & therapies enabled on.     Mode   VVI Lower 40     Battery voltage 7.5 years / 2.99 V      Impedance        RV   418 ohms    RV/SVC defib 38 / 53 ohms     Sensing    R wave  3.3 mV     Pacing thresholds    RV     3.25 V @ 1.00 ms      Impression   1.  Patient is not pacemaker dependent at this time.   2.  Chronic, elevated RV threshold, otherwise normal ICD function pre and post MRI.    Plan   1.  Continue ICD follow up with primary cardiologist, Dr. Gregary Cromer.   2.  ICD parameter settings were reviewed with the patient after MRI.       Device interrogation available in Care Connect under Media Section.

## 2020-12-19 ENCOUNTER — Telehealth: Payer: PRIVATE HEALTH INSURANCE

## 2020-12-19 NOTE — Telephone Encounter
Call Back Request      Reason for call back: Patient calling to set-up Ablation appointment Dr. Glean Hess from [facility] has been speaking to Dr. Talbert Forest about this patient    Any Symptoms:  []  Yes  [x]  No       If yes, what symptoms are you experiencing:    o Duration of symptoms (how long):    o Have you taken medication for symptoms (OTC or Rx):      Patient or caller has been notified of the 24-48 hour turnaround time.

## 2021-01-07 NOTE — Telephone Encounter
PDL Call to Practice    Reason for Call:Patient is following up on making an appt with Dr. Talbert Forest    Appointment Related?  [x]  Yes  []  No     If yes;To be Discussed  Date:  Time:    Call warm transferred to PDL: [x]  Yes  []  No    Call Received by Practice Representative: 

## 2021-01-07 NOTE — Telephone Encounter
Reply by: Sadie Haber  Lets get him in for consult as he will need PET as he has an MRI that is abnormal with concern for sarcoid so would need PET before ablation is considered

## 2021-01-07 NOTE — Telephone Encounter
Patient scheduled for initial consultation with Dr. Talbert Forest.     Christian Russo, pt states Dr. Glean Hess has reached out to you regarding scheduling for ablation.  Do you want to stat looking for dates?  He is scheduled to see you on Mon 9/12.

## 2021-01-12 ENCOUNTER — Ambulatory Visit: Payer: BLUE CROSS/BLUE SHIELD

## 2021-01-12 ENCOUNTER — Non-Acute Institutional Stay: Payer: BLUE CROSS/BLUE SHIELD

## 2021-01-12 DIAGNOSIS — I429 Cardiomyopathy, unspecified: Secondary | ICD-10-CM

## 2021-01-12 NOTE — Patient Instructions
RECOMMENDATIONS:  Cardiac PET scan to assess inflammation

## 2021-01-12 NOTE — Consults
Outpatient Cardiac Arrhythmia Initial consult  Note         PATIENT: Christian Russo   OZH:[id number]    DOB:10/23/1965   DOS: 01/12/2021      PRIMARY CARE PROVIDER: ***   REFERRING PHYSICIAN: Gregary Cromer, MD     REASON FOR CONSULTATION:  Consideration of ablation      MEDICAL PROBLEMS  1. Paroxysmal atrial fibrillation  2. Cardiomyopathy   3. Sing chamber ICD (medtronic)   4. Frequent PVC's  5. Hyperlipidemia  6. Heart failure  7. Tobacco use disorder   8. Ablation of dysrhythmia focus   9. Hand surgery      HISTORY OF PRESENT ILLNESS:  Christian Russo is a 55 y.o. male with longstanding history of symptomatic PVC's in the setting of nonischemic cardiomyopathy status post ICD in 2009. In recurrent heart failure with hospitalizations. He has underwent a ablation in 2013 at [facility] that was unsuccessful and he continues to have symptoms. Given recurrent hospitalizations for heart failure Dr. Glean Hess felt that control of PVC's might help with heart failure management.  He has been hospitalized for heart failure several times in the last year with the initial visit on 06/21/2018. He is followed by Gregary Cromer, MD and referred for possible PVC ablation.      OUTPATIENT MEDICATIONS:  Outpatient Medications Prior to Visit   Medication Sig   ? atorvastatin (LIPITOR) 40 mg tablet    ? escitalopram 20 mg tablet Take 1 tablet (20 mg total) by mouth daily.   ? METOPROLOL SUCCINATE 50 mg 24 hr tablet TAKE 1 TABLET (50 MG TOTAL) BY MOUTH DAILY.   ? ramipril (ALTACE) 2.5 mg capsule    ? tadalafil (CIALIS) 20 mg tablet TAKE 1 TABLET BY MOUTH EVERY DAY AS NEEDED.   ? zolpidem 10 mg tablet TAKE 1 TABLET BY MOUTH AT BEDTIME AS NEEDED.     No facility-administered medications prior to visit.       REVIEW OF SYSTEMS:  A 14 point review of systems was negative except as noted in HPI.     PHYSICAL EXAMINATION:  BP (!) (P) 86/57  ~ Pulse (P) 68  ~ Temp (P) 36.1 ?C (97 ?F) (Temporal)  ~ Resp (P) 18  ~ Ht 5' 6'' (1.676 m)  ~ Wt 158 lb (71.7 kg)  ~ SpO2 (P) 97%  ~ BMI 25.50 kg/m?       Constitutional: No acute distress, interactive, alert. Neurologic: Cranial nerves intact. HEENT: Hearing grossly intact. Vision intact. Mucous membranes moist.  Cardiovascular: Heart is regular.                           DATA:  --ECG 01/12/2021: sinus rhythm with incomplete LBBB. Frequent PVC's of 2 morphologies. PVC 1 is Left bundle V2 transition superior axis. PVC 2 is right bundle w shape in V1 and inferior axis.   --Cardiac MRI 04/15/2020: Dilated left ventricular cardiomyopathy with ventricular dilation and depressed systolic function (LVEF = 26%). Associated midcavity, mid myocardial delayed enhancement in the anterior septum and inferoseptal compatible with fibrosis. Right ventricular dilation (RVEDVi: 182.28ml/m2) with depressed systolic function (RVEF = 13%) Mild aortic insufficiency (regurgitant fraction = 2.8%)  --ECG 10/26/2019: sinus rhythm at 77 bpm with frequent PVC's. Incomplete RBBB  --Echo 02/09/2012: EF 38%        ASSESSMENT:  Christian Russo is a 56 y.o. male with nonischemic cardiomyopathy status post ICD with frequent PVC's. In recurrent heart  failure with septal mid myocardial scar on cardiac MRI. Given scar pattern on MRI we will obtain PET scan to rule out sarcoidosis. If information is found we will treat with antiinflammatory therapy. If PET scan is negative we will further discuss the options of PVC ablation given minimal myocardial scar. Ablation outcomes are lower but reasonable given clearer picture.       Paragraph impression     1. Single chamber ICD Medtronic   2. Nonischemic Cardiomyopathy   3. Frequent PVC's  4. Paroxysmal atrial fibrillation   5. Hyperlipidemia   6. Heart failure   7. Possible Sarcoidosis       RECOMMENDATIONS:  1. Cardiac PET scan to evaluate possible Sarcoidosis.  2. Continue aggressive risk factor reduction of cardiomyopathy, heart failure and PVC's    SCRIBE STATEMENT:  I, Reginal Lutes, am scribing for and in the presence of Heide Spark, M.D. documentation for Christian Russo on 01/12/2021  .

## 2021-01-13 NOTE — Telephone Encounter
Reply by: Lillia Dallas. Larcenia Holaday    I put in an order for the cardiac PET CT and called the patient to give him the number to call to schedule for it.      Thanks,  Carney Bern

## 2021-01-27 DIAGNOSIS — I493 Ventricular premature depolarization: Secondary | ICD-10-CM

## 2021-02-02 ENCOUNTER — Inpatient Hospital Stay: Payer: BLUE CROSS/BLUE SHIELD

## 2021-02-02 ENCOUNTER — Non-Acute Institutional Stay: Payer: BLUE CROSS/BLUE SHIELD

## 2021-02-02 DIAGNOSIS — I493 Ventricular premature depolarization: Secondary | ICD-10-CM

## 2021-02-02 DIAGNOSIS — I428 Other cardiomyopathies: Secondary | ICD-10-CM

## 2021-02-02 DIAGNOSIS — R9389 Abnormal findings on diagnostic imaging of other specified body structures: Secondary | ICD-10-CM

## 2021-02-11 ENCOUNTER — Inpatient Hospital Stay: Payer: BLUE CROSS/BLUE SHIELD

## 2021-02-11 DIAGNOSIS — I493 Ventricular premature depolarization: Secondary | ICD-10-CM

## 2021-02-11 DIAGNOSIS — I428 Other cardiomyopathies: Secondary | ICD-10-CM

## 2021-02-11 DIAGNOSIS — R9389 Abnormal findings on diagnostic imaging of other specified body structures: Secondary | ICD-10-CM

## 2021-02-11 MED ADMIN — TECHNETIUM TC 99M TETROFOSMIN IV KIT: 15 | INTRAVENOUS | @ 15:00:00 | Stop: 2021-02-11 | NDC 17156002630

## 2021-04-15 ENCOUNTER — Telehealth: Payer: BLUE CROSS/BLUE SHIELD

## 2021-04-15 NOTE — Telephone Encounter
Called Nuc Dept but unable to schedule since order has incorrect Dx codes  Submitted request to our NP staff to change the codes accordingly  Will then call to schedule appt for pt  Thank you

## 2021-04-15 NOTE — Telephone Encounter
Spoke to pt. He confirmed he already did the Myocardio portion of the PET but because he had a soda thenight prior, the Nuc Dept did not do the PET Sarcoid portion on Oct 12th  When he called back to reschedule just the PET portion and told them the situation, the Nuc Dept referred pt back to [place]. They were not willing to schedule the exam since pt was just requesting the PET portion and the order had both Myocardio and PET portions.   Advised pt that I would call their dept after 1:00 PM (since they are out to lunch) to schedule the exam  Thank you

## 2021-04-15 NOTE — Telephone Encounter
Message to Practice/Provider      Message: pt states nuclear medicine office requires new orders to do just the PET cardiac sarcoid test    Return call is not being requested by the patient or caller.    Patient or caller has been notified of the turnaround time of 1-2 business day(s).

## 2021-04-21 NOTE — Telephone Encounter
Hi NPs    Please see message below  Pt's PET order is incorrect as its Dx are not covered by Medicare guidelines  I sent an email with the correct Dx. Nuc Dept will not schedule until the Dx codes have been switched  Thank you

## 2021-05-06 NOTE — Telephone Encounter
Reply by: Lillia Dallas. Mahogany Torrance    Hi Walter,    I added dx for left ventricular failure. Can you see if this dx will cover it?    Thanks,  Carney Bern

## 2021-05-08 NOTE — Telephone Encounter
Called Nuc Med dept but per their automated v/m claimed they were closed for the day  Will tray again later  Thank you

## 2021-05-12 NOTE — Telephone Encounter
Called Nuc Dept and per staff the DX are still incorrect  We need to use Dx codes from Groups 6-10 due that the exam is a PET SPECT/FDG exam  Sent a message to our NP staff regarding this change  Thank you

## 2021-05-20 NOTE — Telephone Encounter
Hi NPs    Not sure if you received my email regarding pt below  Thank you

## 2021-05-25 NOTE — Telephone Encounter
Reply by: Lillia Dallas.     Hi Walter,    I emailed nuc med [facility] re: this.  I added in ''abnormal EKG'' as a diagnosis per their recommendation to see if this can suffice to cover.  I have not received a response from them so far.     Thanks,  Carney Bern

## 2021-07-24 ENCOUNTER — Inpatient Hospital Stay: Payer: MEDICARE

## 2021-07-24 ENCOUNTER — Non-Acute Institutional Stay: Payer: BLUE CROSS/BLUE SHIELD

## 2021-07-24 DIAGNOSIS — R9431 Abnormal electrocardiogram [ECG] [EKG]: Secondary | ICD-10-CM

## 2021-07-24 DIAGNOSIS — R9389 Abnormal findings on diagnostic imaging of other specified body structures: Secondary | ICD-10-CM

## 2021-07-24 DIAGNOSIS — I501 Left ventricular failure: Secondary | ICD-10-CM

## 2021-07-24 DIAGNOSIS — I428 Other cardiomyopathies: Secondary | ICD-10-CM

## 2021-07-24 DIAGNOSIS — I493 Ventricular premature depolarization: Secondary | ICD-10-CM

## 2021-08-24 ENCOUNTER — Ambulatory Visit: Payer: BLUE CROSS/BLUE SHIELD

## 2021-08-25 ENCOUNTER — Inpatient Hospital Stay: Payer: BLUE CROSS/BLUE SHIELD

## 2021-08-25 DIAGNOSIS — R9389 Abnormal findings on diagnostic imaging of other specified body structures: Secondary | ICD-10-CM

## 2021-08-25 DIAGNOSIS — R9431 Abnormal electrocardiogram [ECG] [EKG]: Secondary | ICD-10-CM

## 2021-08-25 DIAGNOSIS — I493 Ventricular premature depolarization: Secondary | ICD-10-CM

## 2021-08-25 DIAGNOSIS — I428 Other cardiomyopathies: Secondary | ICD-10-CM

## 2021-08-25 DIAGNOSIS — I501 Left ventricular failure: Secondary | ICD-10-CM

## 2021-08-25 LAB — Glucose,POC: GLUCOSE,POC: 91 mg/dL (ref 65–99)

## 2021-08-25 MED ADMIN — PET ISOTOPE 18-F FDG: 10.1 | INTRAVENOUS | @ 16:00:00 | Stop: 2021-08-25 | NDC 76394381201

## 2021-08-25 MED ADMIN — TECHNETIUM TC 99M TETROFOSMIN IV KIT: 15 | INTRAVENOUS | @ 16:00:00 | Stop: 2021-08-25 | NDC 17156002630

## 2021-10-12 ENCOUNTER — Ambulatory Visit: Payer: MEDICAID

## 2021-10-12 ENCOUNTER — Ambulatory Visit: Payer: BLUE CROSS/BLUE SHIELD

## 2021-10-12 DIAGNOSIS — I429 Cardiomyopathy, unspecified: Secondary | ICD-10-CM

## 2021-10-12 NOTE — Progress Notes
Outpatient Cardiac Arrhythmia Initial consult  Note         PATIENT: Christian Russo   ZOX:[id number]    DOB:1966/05/03   DOS: 10/12/2021        REFERRING PHYSICIAN: Gregary Cromer, MD     REASON FOR CONSULTATION:  Consideration of ablation      MEDICAL PROBLEMS  1. NICM  2. Sing chamber ICD (medtronic)   3. Frequent PVC's       HISTORY OF PRESENT ILLNESS:  Christian Russo is a 56 y.o. male with longstanding history of symptomatic PVC's in the setting of nonischemic cardiomyopathy status post ICD in 2009. In recurrent heart failure with hospitalizations. He has underwent a ablation in 2013 at [facility] that was unsuccessful and he continues to have symptoms. Given recurrent hospitalizations for heart failure Dr. Glean Hess felt that control of PVC's might help with heart failure management.  He has been hospitalized for heart failure several times in the last year with the initial visit on 06/21/2018. He is followed by Gregary Cromer, MD and referred for possible PVC ablation.      OUTPATIENT MEDICATIONS:  Outpatient Medications Prior to Visit   Medication Sig   ? CORLANOR 5 MG tablet    ? escitalopram 20 mg tablet Take 1 tablet (20 mg total) by mouth daily.   ? METOPROLOL SUCCINATE 50 mg 24 hr tablet TAKE 1 TABLET (50 MG TOTAL) BY MOUTH DAILY.   ? VERQUVO 10 MG TABS    ? zolpidem 10 mg tablet TAKE 1 TABLET BY MOUTH AT BEDTIME AS NEEDED.   ? atorvastatin (LIPITOR) 40 mg tablet  (Patient not taking: Reported on 01/12/2021.)   ? ramipril (ALTACE) 2.5 mg capsule  (Patient not taking: Reported on 01/12/2021.)   ? tadalafil (CIALIS) 20 mg tablet TAKE 1 TABLET BY MOUTH EVERY DAY AS NEEDED. (Patient not taking: Reported on 10/12/2021.)     No facility-administered medications prior to visit.       REVIEW OF SYSTEMS:  A 14 point review of systems was negative except as noted in HPI.     PHYSICAL EXAMINATION:  BP 101/72  ~ Pulse (!) 40  ~ Temp 36.8 ?C (98.2 ?F) (Temporal)  ~ Resp 16  ~ Ht 5' 6'' (1.676 m)  ~ SpO2 98%  ~ BMI 25.50 kg/m?       Constitutional: No acute distress, interactive, alert. Neurologic: Cranial nerves intact. HEENT: Hearing grossly intact. Vision intact. Mucous membranes moist.  Cardiovascular: Heart is regular.                           DATA:  --ECG 01/12/2021: sinus rhythm with incomplete LBBB. Frequent PVC's of 2 morphologies. PVC 1 is Left bundle V2 transition superior axis. PVC 2 is right bundle w shape in V1 and inferior axis.   --Cardiac MRI 04/15/2020: Dilated left ventricular cardiomyopathy with ventricular dilation and depressed systolic function (LVEF = 26%). Associated midcavity, mid myocardial delayed enhancement in the anterior septum and inferoseptal compatible with fibrosis. Right ventricular dilation (RVEDVi: 182.62ml/m2) with depressed systolic function (RVEF = 13%) Mild aortic insufficiency (regurgitant fraction = 2.8%)  --ECG 10/26/2019: sinus rhythm at 77 bpm with frequent PVC's. Incomplete RBBB  --Echo 02/09/2012: EF 38%   --PET cardiac 08/25/21:  IMPRESSION:   1. No evidence of cardiac inflammatory changes.  2. Dilated ventricle with global hypokinesis and akinesis of the interior, septal and distal wall. Ejection fraction of 15%  3. Inferior  wall perfusion defect likely correlating with scar tissue described on cardiac MR cardiac of 04/15/2020.  4. PET-CT scan demonstrates no evidence of active extra-cardiac inflammatory changes.    ECG and 12-lead rhythm strip 10/12/21:  Frequent PVCs (~50%) in bigeminal pattern with at least 2 morphologies as previous described.     ASSESSMENT:  Christian Russo is a 56 y.o. male with nonischemic cardiomyopathy status post ICD with frequent PVC's. In recurrent heart failure with septal mid myocardial scar on cardiac MRI. PET scan negative for active inflammation.    1. Nonischemic Cardiomyopathy (EF 15% on PET)  2. Frequent PVC's       The procedure of catheter ablation of PVCs was discussed in detail with Christian Russo. Risks, benefits, alternatives and possible outcomes of the procedure were explained at length. We talked about options of observation, medical therapy, and catheter ablation. Risks of procedure include, but are not limited to: infection, bleeding requiring blood transfusion, lung injury or collapse, blood vessel damage, heart injury or perforation, cardiac tamponade, heart attack, stroke, phrenic nerve injury, heart block requiring a permanent pacemaker implantation, worsened arrhythmias, need for emergency open heart or vascular surgery, and even death. In particular, the 1% risk of complete heart block requiring a permanent pacemaker was emphasized to the patient. Major adverse events were estimated at less than 1%.   Possible outcomes of the catheter ablation of PVCs include:  1. Successful RF ablation of PVCs  2. Risk of damage to vital structures is found to be high during the procedure, and ablation is deferred.  3. No PVCs can be seen, therefore no ablation can be performed.  4. Multifocal PVCs are seen without dominant morphology, therefore ablation is not done.  Christian Russo was given literature and all his questions were answered. The patient fully understands the procedure, risks, benefits, and alternatives.     RECOMMENDATIONS:  1. Will discuss with Dr. Glean Hess referral to cardiomyopathy clinic here at [facility] for medical optimization and consideration of advanced HF therapies.  2. Will plan for PVC ablation.    The patient was seen and discussed with Dr. Talbert Forest.    Christian Lema, MD, PhD  Cardiac Electrophysiology Fellow

## 2021-10-13 DIAGNOSIS — Z95810 Presence of automatic (implantable) cardiac defibrillator: Secondary | ICD-10-CM

## 2021-10-13 DIAGNOSIS — I429 Cardiomyopathy, unspecified: Secondary | ICD-10-CM

## 2021-10-13 DIAGNOSIS — I493 Ventricular premature depolarization: Secondary | ICD-10-CM

## 2021-10-13 NOTE — Progress Notes
SEE MEDIA SECTION FOR ACTUAL PRINTOUT     DATE OF SERVICE: 10/12/2021    Manufacturer: Medtronic   Model: Visia AF MRI VR [id number]  Serial # [id number]  Implant Date: 12/02/2016  Lead: [id number] [employer] Quattro Secure    Remaining Longevity: 5.1 years    Underlying rhythm: V sense, irregular 13-112 BPM    Thresholds:  R wave: 6.0 mV  RV lead impedance: 475 ohms  RV/SVC impedance: 43/64 ohms   RV capture voltage: n/a  (Patient requested to skip threshold testing for this visit)    Since 05/20/2021:  Afib/AFL/SVT episodes: AT/AF x 2501, latest detected on 10/12/2021 at 1703 hrs.   VT/Vfib episodes: Monitored VT detected on 09/24/2021 at 1912 hrs.   VP = 0.3%    Settings:   VVI 40 ppm    Changes made this session: None, temporarily reprogrammed for testing purposes only.    Impression:   1. Normal ICD function.   2. AT/AF and VT (falling in the monitor zone) as noted above. Please refer to scanned report for further details.     Plan:   1. Patient to see Dr. Talbert Forest today with prinout. Please see separate EP note.  2. Return to device clinic in accordance with Dr. Frutoso Chase clinical note, with interim remote transmission.

## 2021-10-28 ENCOUNTER — Telehealth: Payer: BLUE CROSS/BLUE SHIELD

## 2021-10-28 NOTE — Telephone Encounter
Call Back Request      Reason for call back: Patient is requesting a call back to schedule his procedure and he would also like to have his ICD report sent to his general cardiologist. Please assist, thank you.     FAX: [phone]  ATTN: Dr. Glean Hess        Any Symptoms:  []  Yes  [x]  No       If yes, what symptoms are you experiencing:    o Duration of symptoms (how long):    o Have you taken medication for symptoms (OTC or Rx):      If call was taken outside of clinic hours:    [] Patient or caller has been notified that this message was sent outside of normal clinic hours.     [] Patient or caller has been warm transferred to the physician's answering service. If applicable, patient or caller informed to please call back if symptoms progress.  Patient or caller has been notified of the turnaround time of 1-2 business day(s).

## 2021-11-09 ENCOUNTER — Ambulatory Visit: Payer: BLUE CROSS/BLUE SHIELD

## 2021-11-09 ENCOUNTER — Telehealth: Payer: BLUE CROSS/BLUE SHIELD

## 2021-11-09 ENCOUNTER — Inpatient Hospital Stay: Payer: MEDICAID

## 2021-11-09 NOTE — Telephone Encounter
I spoke to Mr. Christian Russo, the ICD check has been sent to his PCP.  HIs ablation has been scheduled for 12/01/2021

## 2021-11-09 NOTE — Telephone Encounter
Message to Practice/Provider      Message:   Patient called in and wanted to check to see if the ICD report was already sent to his cardiologist at [facility]. Also patient needs to schedule an ablation procedure so I connected him to the office for further assistance.  Return call is not being requested by the patient or caller.    Patient or caller has been notified of the turnaround time of 1-2 business day(s).

## 2021-11-09 NOTE — Telephone Encounter
ICD report faxed to Dr. Glean Hess via Epic. Mare Loan spoke with patient and scheduled procedure

## 2021-12-01 ENCOUNTER — Ambulatory Visit: Payer: BLUE CROSS/BLUE SHIELD

## 2021-12-01 ENCOUNTER — Telehealth: Payer: BLUE CROSS/BLUE SHIELD

## 2021-12-01 LAB — Prothrombin Time Panel: INR: 1 s (ref 11.5–14.4)

## 2021-12-01 LAB — Urea Nitrogen: UREA NITROGEN: 11 mg/dL (ref 7–22)

## 2021-12-01 LAB — Platelet Count: PLATELET COUNT, AUTO: 285 10*3/uL (ref 143–398)

## 2021-12-01 LAB — Glucose, Whole Blood: GLUCOSE, WHOLE BLOOD: 86 mg/dL (ref 65–99)

## 2021-12-01 LAB — Hematocrit: HEMATOCRIT: 44.4 (ref 38.5–52.0)

## 2021-12-01 LAB — APTT: APTT: 29.3 s (ref 24.4–36.2)

## 2021-12-01 LAB — Hemoglobin: HEMOGLOBIN: 14.8 g/dL (ref 13.5–17.1)

## 2021-12-01 LAB — CREATININE: ESTIMATED GFR 2021 CKD-EPI: 88 mL/min/{1.73_m2} (ref 0.60–1.30)

## 2021-12-01 LAB — WBC Count, ANES: WHITE BLOOD CELL COUNT: 6.9 10*3/uL (ref 4.16–9.95)

## 2021-12-01 LAB — Electrolyte Panel: CHLORIDE: 103 mmol/L (ref 96–106)

## 2021-12-01 MED ADMIN — NOREPINEPHRINE BITARTRATE 1 MG/ML IV SOLN: INTRAVENOUS | @ 22:00:00 | Stop: 2021-12-01 | NDC 36000016210

## 2021-12-01 MED ADMIN — HEPARIN SODIUM (PORCINE) 1000 UNIT/ML IJ SOLN: INTRAVENOUS | @ 21:00:00 | Stop: 2021-12-01 | NDC 00409272002

## 2021-12-01 MED ADMIN — NOREPINEPHRINE BITARTRATE 1 MG/ML IV SOLN: INTRAVENOUS | @ 20:00:00 | Stop: 2021-12-01 | NDC 36000016210

## 2021-12-01 MED ADMIN — PLASMA-LYTE A IV SOLN: INTRAVENOUS | @ 20:00:00 | Stop: 2021-12-01 | NDC 00338022104

## 2021-12-01 MED ADMIN — PROPOFOL 200 MG/20ML IV EMUL: INTRAVENOUS | @ 22:00:00 | Stop: 2021-12-01 | NDC 63323026929

## 2021-12-01 MED ADMIN — PROPOFOL 200 MG/20ML IV EMUL: INTRAVENOUS | @ 20:00:00 | Stop: 2021-12-01 | NDC 63323026929

## 2021-12-01 MED ADMIN — REMIFENTANIL HCL 1 MG IV SOLR (ANES): INTRAVENOUS | @ 21:00:00 | Stop: 2021-12-01

## 2021-12-01 MED ADMIN — NOREPINEPHRINE-SODIUM CHLORIDE 8-0.9 MG/250ML-% IV SOLN (TITRATABLE/ICU): .35935.85 ug/min | INTRAVENOUS | @ 22:00:00 | Stop: 2021-12-02

## 2021-12-01 MED ADMIN — PROPOFOL 200 MG/20ML IV EMUL (ANES): INTRAVENOUS | @ 21:00:00 | Stop: 2021-12-01

## 2021-12-01 MED ADMIN — PROPOFOL 200 MG/20ML IV EMUL: INTRAVENOUS | @ 21:00:00 | Stop: 2021-12-01 | NDC 63323026929

## 2021-12-01 MED ADMIN — HEPARIN SODIUM (PORCINE) 1000 UNIT/ML IJ SOLN: INTRAVENOUS | @ 20:00:00 | Stop: 2021-12-01

## 2021-12-01 MED ADMIN — HEPARIN SODIUM (PORCINE) 1000 UNIT/ML IJ SOLN: INTRAVENOUS | @ 22:00:00 | Stop: 2021-12-01

## 2021-12-01 MED ADMIN — FENTANYL CITRATE (PF) 100 MCG/2ML IJ SOLN: INTRAVENOUS | @ 20:00:00 | Stop: 2021-12-01 | NDC 00409909422

## 2021-12-01 MED ADMIN — ACETAMINOPHEN 10 MG/ML IV SOLN: 1000 mg | INTRAVENOUS | Stop: 2021-12-02 | NDC 63323043441

## 2021-12-01 MED ADMIN — PROPOFOL 200 MG/20ML IV EMUL (ANES): INTRAVENOUS | @ 20:00:00 | Stop: 2021-12-01

## 2021-12-01 MED ADMIN — NOREPINEPHRINE-SODIUM CHLORIDE 8-0.9 MG/250ML-% IV SOLN (TITRATABLE/ICU): .35935.85 ug/min | INTRAVENOUS | @ 20:00:00 | Stop: 2021-12-02 | NDC 44567064110

## 2021-12-01 MED ADMIN — PROTAMINE SULFATE 10 MG/ML IV SOLN: INTRAVENOUS | @ 22:00:00 | Stop: 2021-12-01 | NDC 63323022905

## 2021-12-01 MED ADMIN — PROPOFOL 200 MG/20ML IV EMUL (ANES): INTRAVENOUS | @ 22:00:00 | Stop: 2021-12-01

## 2021-12-01 MED ADMIN — HEPARIN SODIUM (PORCINE) 1000 UNIT/ML IJ SOLN: INTRAVENOUS | @ 22:00:00 | Stop: 2021-12-01 | NDC 00409272002

## 2021-12-01 MED ADMIN — PLASMA-LYTE A IV SOLN: @ 20:00:00 | Stop: 2021-12-01

## 2021-12-01 MED ADMIN — NOREPINEPHRINE-SODIUM CHLORIDE 8-0.9 MG/250ML-% IV SOLN (TITRATABLE/ICU): .35935.85 ug/min | INTRAVENOUS | @ 21:00:00 | Stop: 2021-12-02

## 2021-12-01 MED ADMIN — LIDOCAINE HCL (PF) 1 % IJ SOLN: .4 mL | INTRADERMAL | @ 18:00:00 | Stop: 2021-12-01 | NDC 63323049257

## 2021-12-01 MED ADMIN — REMIFENTANIL HCL 1 MG IV SOLR (ANES): INTRAVENOUS | @ 20:00:00 | Stop: 2021-12-01

## 2021-12-01 MED ADMIN — HEPARIN SODIUM (PORCINE) 1000 UNIT/ML IJ SOLN: INTRAVENOUS | @ 20:00:00 | Stop: 2021-12-01 | NDC 00409272002

## 2021-12-01 NOTE — Telephone Encounter
Patient has checked in to the admissions department for his procedure.

## 2021-12-01 NOTE — H&P
Outpatient Cardiac Arrhythmia Pre-Procedure H&P      PATIENT: Christian Russo   ZOX:[id number]    DOB:1966-01-18   DOS: 12/01/2021        REFERRING PHYSICIAN: Gregary Cromer, MD     REASON FOR CONSULTATION:  Consideration of ablation      MEDICAL PROBLEMS  1. NICM  2. Sing chamber ICD (medtronic)   3. Frequent PVC's       HISTORY OF PRESENT ILLNESS:  Christian Russo is a 56 y.o. male with longstanding history of symptomatic PVC's in the setting of nonischemic cardiomyopathy status post ICD in 2009. In recurrent heart failure with hospitalizations. He has underwent a ablation in 2013 at [facility] that was unsuccessful and he continues to have symptoms. Given recurrent hospitalizations for heart failure Dr. Glean Hess felt that control of PVC's might help with heart failure management.  He has been hospitalized for heart failure several times in the last year with the initial visit on 06/21/2018. He is followed by Gregary Cromer, MD and referred for possible PVC ablation.    Mr. Longton presents today for PVC ablation      OUTPATIENT MEDICATIONS:  (Not in an outpatient encounter)      REVIEW OF SYSTEMS:  A 14 point review of systems was negative except as noted in HPI.     PHYSICAL EXAMINATION:  Last Recorded Vital Signs:    12/01/21 1016   BP: 101/58   Pulse: (!) 41   Resp: 17   Temp: 36.8 ?C (98.2 ?F)   SpO2: 97%     Constitutional: No acute distress, interactive, alert. Neurologic: Cranial nerves intact. HEENT: Hearing grossly intact. Vision intact. Mucous membranes moist.  Cardiovascular: Heart is regular.                           DATA:  --ECG 01/12/2021: sinus rhythm with incomplete LBBB. Frequent PVC's of 2 morphologies. PVC 1 is Left bundle V2 transition superior axis. PVC 2 is right bundle w shape in V1 and inferior axis.   --Cardiac MRI 04/15/2020: Dilated left ventricular cardiomyopathy with ventricular dilation and depressed systolic function (LVEF = 26%). Associated midcavity, mid myocardial delayed enhancement in the anterior septum and inferoseptal compatible with fibrosis. Right ventricular dilation (RVEDVi: 182.32ml/m2) with depressed systolic function (RVEF = 13%) Mild aortic insufficiency (regurgitant fraction = 2.8%)  --ECG 10/26/2019: sinus rhythm at 77 bpm with frequent PVC's. Incomplete RBBB  --Echo 02/09/2012: EF 38%   --PET cardiac 08/25/21:  IMPRESSION:   1. No evidence of cardiac inflammatory changes.  2. Dilated ventricle with global hypokinesis and akinesis of the interior, septal and distal wall. Ejection fraction of 15%  3. Inferior wall perfusion defect likely correlating with scar tissue described on cardiac MR cardiac of 04/15/2020.  4. PET-CT scan demonstrates no evidence of active extra-cardiac inflammatory changes.    ECG and 12-lead rhythm strip 10/12/21:  Frequent PVCs (~50%) in bigeminal pattern with at least 2 morphologies as previous described.     ASSESSMENT:  Christian Russo is a 56 y.o. male with nonischemic cardiomyopathy status post ICD with frequent PVC's. In recurrent heart failure with septal mid myocardial scar on cardiac MRI. PET scan negative for active inflammation.    1. Nonischemic Cardiomyopathy (EF 15% on PET)  2. Frequent PVC's       The procedure of catheter ablation of PVCs was discussed in detail with [name]. Risks, benefits, alternatives and possible outcomes of the procedure  were explained at length. We talked about options of observation, medical therapy, and catheter ablation. Risks of procedure include, but are not limited to: infection, bleeding requiring blood transfusion, lung injury or collapse, blood vessel damage, heart injury or perforation, cardiac tamponade, heart attack, stroke, phrenic nerve injury, heart block requiring a permanent pacemaker implantation, worsened arrhythmias, need for emergency open heart or vascular surgery, and even death. In particular, the 1% risk of complete heart block requiring a permanent pacemaker was emphasized to the patient. Major adverse events were estimated at less than 1%.   Possible outcomes of the catheter ablation of PVCs include:  1. Successful RF ablation of PVCs  2. Risk of damage to vital structures is found to be high during the procedure, and ablation is deferred.  3. No PVCs can be seen, therefore no ablation can be performed.  4. Multifocal PVCs are seen without dominant morphology, therefore ablation is not done.  Mr. Foushee was given literature and all his questions were answered. The patient fully understands the procedure, risks, benefits, and alternatives.     RECOMMENDATIONS:  1 Proceed with PVC ablation.

## 2021-12-01 NOTE — H&P
UPDATED H&P REQUIREMENT    For Ardyth Harps [facility] and Norval Gable [facility] and [facility]    WHAT IS THE STATUS OF THE PATIENT'S MOST CURRENT HISTORY AND PHYSICAL?   - There is no recent H&P <30 days.  Proceed to H&P Notes section for new H&P.     REFER TO MEDICAL STAFF POLICIES REGARDING PRE-PROCEDURE HISTORY AND PHYSICAL EXAMINATION AND UPDATED H&P REQUIREMENTS BELOW:    Ardyth Harps [facility] and [facility] and [facility] Medical Staff Policy 200 - For Patients Undergoing Procedures Requiring Moderate or Deep Sedation, General Anesthesia or Regional Anesthesia    Contents of a History and Physical Examination (H&P):    The H&P shall consist of chief complaint, history of present illness, allergies and medications, relevant social and family history, past medical history, review of systems and physical examination, and assessment and plan appropriate to the patient's age.    For Patients Undergoing Procedures Requiring Moderate or Deep Sedation, General Anesthesia or Regional Anesthesia:    1. An H&P shall be performed within 24 hours prior to the procedure by a qualified member of the medical staff or designee with appropriate privileges, except as noted in item 2 below.    2. If a complete history and physical was performed within thirty (30) calendar days prior to the patient?s admission to the Medical Center for elective surgery, a member of the medical staff assumes the responsibility for the accuracy of the clinical information and will need to document in the medical record within twenty-four (24) hours of admission and prior to surgery or major invasive procedure, that they either attest that the history and physical has been reviewed and accepted, or document an update of the original history and physical relevant to the patient's current clinical status.    3. Providing an H&P for patients undergoing surgery under local anesthesia is at the discretion of the Attending Physician.     4. When a procedure is performed by a dentist, podiatrist or other practitioner who is not privileged to perform an H&P, the anesthesiologist?s assessment immediately prior to the procedure will constitute the 24 hour re-assessment.The dentist, podiatrist or other practitioner who is not privileged to perform an H&P will document the history and physical relevant to the procedure.    5. If the H&P and the written informed consent for the surgery or procedure are not recorded in the patient's medical record prior to surgery, the operation shall not be performed unless the attending physician states in writing that such a delay could lead to an adverse event or irreversible damage to the patient.    6. The above requirements shall not preclude the rendering of emergency medical or surgical care to a patient in dire circumstances.

## 2021-12-01 NOTE — Op Note
ELECTROPHYSIOLOGY STUDY & MAPPING OF PREMATURE VENTRICULAR CONTRACTIONS (PVC)    DATE OF PROCEDURE: 12/01/2021    PATIENT: Christian Russo  MRN: [id number]  DOB: 1965/06/22    REFERRING PRACTITIONER: Christian Russo.*    ATTENDING PHYSICIAN: Christian Spark, MD  EP FELLOW: Christian Kin MD, MS, Christian Massy, MD    PRE-OPERATIVE DIAGNOSIS: Premature ventricular contraction refractory to medical therapy and prior PVC ablation    POST-OPERATIVE DIAGNOSIS:  Premature ventricular contractions from the left ventricular septum in the LVOT near conduction system    PROCEDURE(S):  Diagnostic electrophysiology study  Electroanatomic mapping (ENSITE-X)  Transseptal catheterization of the left heart  Intracardiac echocardiography  Fluoroscopy operation, supervision and interpretation  ICD interrogation and reprogramming    ANESTHESIA: MAC per Anesthesia Service; Lidocaine 1% SQ for local anesthesia    SURGICAL PROPHYLAXIS: None    CLINICAL HISTORY:  Christian Russo is a 56 y.o. male with history of NICM, LVEF 25%, drug refractory premature ventricular contractions also refractory to prior ablation, s/p ICD placement. Arrhythmia burden prior to the procedure: frequent PVCs in bigeminy pattern thought to be contributing to reduced ejection fraction. The patient now presents for EPS and catheter ablation of ventricular arrhythmia.    INFORMED CONSENT:  The procedure of catheter ablation of PVC was discussed in detail with Christian Russo. Risks, benefits, alternatives and possible outcomes of the procedure were explained at length. We talked about options of observation, medical therapy, and catheter ablation. Risks of procedure include, but are not limited to: infection, bleeding requiring blood transfusion, lung injury or collapse, blood vessel damage, heart injury or perforation, cardiac tamponade, heart attack, stroke, phrenic nerve injury, heart block requiring a permanent pacemaker implantation, worsened arrhythmias, need for emergency open heart surgery, and even death. In particular, risk of complete heart block requiring a permanent pacemaker was emphasized to the patient. Major adverse events were estimated at approximately 5%. Christian Russo was given literature and all his questions were answered. The patient fully understands the procedure, risks, benefits, and alternatives. Christian Russo signed the written informed consent and a copy of it was placed in the chart.    SHEATHS/ACCESS:  Right femoral vein:   42F, upsized for 8.78F Versacross sheath exchanged for Agilis sheath  Left femoral vein:   78F  27F    CATHETERS:  78F Hexapolar catheter via 78F sheath in the left femoral vein  78F ICE catheter via 27F long Arrow sheath in the left femoral vein  HD grid catheter via Versacross sheath in the RV then via Agilis sheath in the right femoral vein    A Hexaploar catheter was advanced from the left femoral vein via short sheath and positioned in the bundle of His and RV under electroanatomic and fluoroscopic guidance.  An ICE catheter was advanced from the left femoral vein through the 27F long Arrow sheath and positioned in the right atrium under electroanatomic, fluoroscopic and intracardiac ultrasound guidance.   HD grid was advanced from the right femoral vein via Versacross and Agilis sheaths into the right ventricle and left ventricle under the electroanatomic and fluoroscopic guidance.    PROCEDURE DETAILS:  Christian Russo was brought to the EP Lab.  The patient was placed under MAC by Anesthesia Service. ICD was interrogated and tachy therapies were switched OFF. The patient was prepped and draped in the usual sterile fashion. After administration of local anesthesia, venous access was obtained to the right femoral vein and left femoral vein with 18-gauge Cook percutaneous needle.  Catheters were  then advanced and positioned under electroanatomic guidance and fluoroscopy. Transseptal catheterization was performed. Following the diagnostic study, radiofrequency ablation was deemed to be high risk for heart block therefore no ablation was performed. At the end of the procedure, protamine was given after a test dose of 1 mg to reverse anticoagulation. Following that, all catheters and sheaths were removed. Hemostasis was obtained with manual compression. ICD was reinterrogated and tachy therapies were switched ON.    TRANSSEPTAL CATHETERIZATION:  Heparin 100 units/kg was administered intravenously as a bolus. The 8 [place] vascular sheath in the right femoral vein was replaced with an 8.5 Fr Versacross system using exchange guidewire technique. After RV map was completed, under fluoroscopic guidance, then the Versacross sheath was positioned in the superior vena cava over the wire. After dragging the sheath down to the fossa ovalis in the LAO view as well as with intracardiac echocardiography, the RF wire was advanced against the septum following confirmation of the location of maximal tent on ICE. RF energy was applied and the RF wire was advanced through the septum into the left atrium, where position confirmed with fluoroscopy and intracardiac echocardiography. The dilator and sheath were then advanced over the wire to pre-dilate the interatrial septum and withdrawn over the wire. The Agilis sheath was then advanced over the wire into the left atrium, the dilator was removed, and the system was aspirated and flushed. Boluses and infusion of heparin were given to keep ACT over 300 sec. Transseptal sheath received continuous heparinized saline flush throughout the procedure. ICE was used for anatomic guidance, to monitor the development of a pericardial effusion and for monitoring microbubble formation during LA ablation.    BASELINE FINDINGS:  Sinus rhythm with RR interval 930 ms, AH interval 155 ms, HV interval 70 ms. Frequent PVCs were noted.    ARRHYTHMIAS:   1. PVC 1  Initiation: Spontaneous off Isoproterenol  QRS morphology: Right bundle branch block. superior axis. Positive in lead I. Transition in lead V3-V4.  This PVC was mapped to the inferior septum.      2. PVC 2  Initiation: Spontaneous off Isoproterenol  QRS morphology: Right bundle branch block, Inferior axis, RSr' in lead I. Transition V3.   Activation map showed earliest site of activation in the high LVOT near the bundle of His. Earliest site was 39 ms pre-QRS. Given proximity to conduction system, ablation was deemed to be of high risk of AV block.        ELECTROANATOMIC MAPPING:  1. A right and left ventricular electroanatomic map was made with the ENSITE-X system for PVC 2.   2. A left ventricular electroanatomic map was made with the ENSITE-X system for PVC 1.   3. Voltage map of the LV endocardium was obtained. LV endocardial map showed abnormal signals consistent with scarring in the LV septum.  4. The right and left ventricles were mapped in tachycardia. The earliest ventricular activation site was mapped to the LVOT near conduction system for PVC 2.     Voltage map of the LV during PVC showing septal scar. Yellow spheres represent location of His signals.       Voltage map of the LV during PVC showing septal scar. Yellow spheres represent location of His signals.      Activation map of PVC-2 of the left ventricle showing earliest site of ventricualr activation in the high LVOT. Yellow spheres represent location of His signals.      Activation map of PVC-2 of the left ventricle  showing earliest site of ventricualr activation in the high LVOT. Yellow spheres represent location of His signals.      Activation map of PVC-2 of the right and left ventricles showing earliest site of ventricualr activation in the high LVOT.      Activation map of the right and left ventricles of PVC-2 showing earliest site of ventricualr activation in the high LVOT.      Activation map of PVC-1 of the LV showing site of earliest activation to inferior septum.       ABLATION:  Given proximity of earliest ventricular activation fof PVC 2 to conduction system, ablation was deemed to be of high risk of AV block rendering the patient pacemaker dependent. No ablation was performed.    POST EP STUDY FINDINGS:  No pericardial effusion (confirmed with intracardiac echocardiography)    COMPLICATIONS: None  ESTIMATED BLOOD LOSS: 10 mL  CONTRAST: 0 mL  FLUOROSCOPY DATA:6.7 minutes, dose 32 mGy, DAP 351.6 uGy*m^2    ABLATION TIME: 0 minutes    FINAL DIAGNOSES:  1. Premature ventricular contractions of two main morphologies mapping to the LVOT near conduction system and inferior septum of LV  2. No ablation was performed given high risk of heart block    PLAN:  Follow up with Dr. Talbert Forest at the [facility]  in 1 month.  Consider upgrade to BiV pacing before attempting ablation in the future.    Christian Kin, MD, MS  Clinical Cardiac Electrophysiology Fellow     Attending Attestation: agree with procedure note as above.  I supervised Dr Urbano Heir for the duration of the procedure.

## 2021-12-01 NOTE — Telephone Encounter
PDL Call to Clinic    Reason for Call: The patient has an ablation scheduled today with Dr. Talbert Forest and the patient says he is at the [facility] hospital but does not know where to go to check in.     Appointment Related?  []  Yes  [x]  No     If yes;  Date:  Time:    Call warm transferred to PDL: [x]  Yes  []  No    Call Received by Clinic Representative:    If call not answered/not accepted, call received by Patient Services Representative:

## 2021-12-02 NOTE — Discharge Instructions
Discharge Instructions Following EP Study    Medications:   Continue Metoprolol, to prevent rapid heart rates.    Follow up:  You have already been scheduled for a clinic follow-up appointment with your physician.  Please see after visit summary for the appointment date and time.    Caring for your wound:  You may remove the bandage tomorrow and it is okay to shower but please avoid submerged bathing / swimming for one week.  If you notice bleeding or a growing lump in your groin, lay down and hold pressure over the area for 15 minutes. If the bleeding or swelling has stopped, remain laying down for 2 hours. If the bleeding does not stop or you notice pain or numbness in your groin or leg, call 911 or have someone take you to the emergency department.    For the next 24 hours, do not drive or operate heavy machinery.     For the next 7 days, avoid strenuous activity or lifting pushing, or pulling more than 10 pounds. It is okay to shower but please avoid baths, pools, hot tubs, or the ocean.     Normal symptoms following ablation  It is common to have a sore throat after the procedure. Lozenges can help.  Mild soreness or bruising are normal and should get better within a week.    If you have any of the following symptoms, call our clinic @ [phone] or after hours [phone] (page operator) and ask to speak to the Adult Electrophysiology physician on call:  Fever over 100.5 degrees.  Redness, warmth, and swelling around the access site.  Mild chest pain.    Call 911 or have someone take you to the nearest emergency department if you have any of the following symptoms:  Signs of a stroke.  Ongoing bleeding from your access site. Hold constant pressure over the site while waiting for help.  Passing out, lightheadedness and low blood pressure.

## 2021-12-11 LAB — Activated Clotting Time, POC
ACTIVATED CLOTTING TIME HIGH RANGE, POCT: 134 s
ACTIVATED CLOTTING TIME HIGH RANGE, POCT: 152 s
ACTIVATED CLOTTING TIME HIGH RANGE, POCT: 205 s
ACTIVATED CLOTTING TIME HIGH RANGE, POCT: 205 s
ACTIVATED CLOTTING TIME HIGH RANGE, POCT: 278 s
ACTIVATED CLOTTING TIME HIGH RANGE, POCT: 334 s

## 2022-01-11 ENCOUNTER — Ambulatory Visit: Payer: BLUE CROSS/BLUE SHIELD

## 2022-01-11 DIAGNOSIS — I429 Cardiomyopathy, unspecified: Secondary | ICD-10-CM

## 2022-01-11 NOTE — Progress Notes
Outpatient Cardiac Arrhythmia Follow-Up Note         PATIENT: Christian Russo   NFA:[id number]    DOB:Mar 20, 1966   DOS: 01/11/2022        REFERRING PHYSICIAN: Gregary Cromer, MD     REASON FOR CONSULTATION:  Consideration of ablation      MEDICAL PROBLEMS  1. Nonischemic Cardiomyopathy  1. Unclear etiology. Possibly viral induced?  2. EF 38% on Echo 02/09/12 2013  3. EF 26% with anteroseptal and inferoseptal midmyocardial DE on CMR in 04/15/20  4. Cors neg 07/01/20  5. EF 15% on PET 08/25/21 (no inflammation)  2. Frequent Symptomatic PVC's  1. PVC ablation attempt 2013  2. PVC ablation attempt 12/01/2021 - mapped to 2 morphologies around LVOT and inferoseptal LV, aborted due to proximity to conduction system/AVN necessitating need for (ideally) biventricular pacing  3. S/P MDT SC ICD  1. Original implant 2009 for primary prevention, history of inappropriate ICD shock for AF in 2013  2. Gen change in 12/02/16  4. Paroxsymal AF  1. CHADSVASC 1  2. Previously on apixaban until 08/2021 stopped in the setting of posterior vitreous hemorrhage with his cardiologist approval       HISTORY OF PRESENT ILLNESS:  Christian Russo is a 56 y.o. male with longstanding history of symptomatic PVC's in the setting of nonischemic cardiomyopathy status post ICD in 2009. In recurrent heart failure with hospitalizations. He has underwent a ablation in 2013 at [facility] medical center that was unsuccessful and he continues to have symptoms. Given recurrent hospitalizations for heart failure Dr. Glean Hess felt that control of PVC's might help with heart failure management.  He has been hospitalized for heart failure several times in the last year with the initial visit on 06/21/2018. He is followed by Gregary Cromer, MD and referred for possible PVC ablation.     Interval Hx 01/11/22:   Underwent PVC ablation attempt 12/01/21 with 2 PVC morphologies mapping to the LVOT near the conduction system and inferior septum of the LV. Unfortunately, due to proximity and risk for AV block the ablation attempt was aborted. He reports continued chronic palpitations from the PVCs but still remains active going hiking with his dog. No ICD shocks or syncope.  ?  OUTPATIENT MEDICATIONS:  Outpatient Medications Prior to Visit   Medication Sig   ? CORLANOR 5 MG tablet    ? DIGOXIN PO Take by mouth Pt unsure of dosage.   ? ENTRESTO 24-26 MG tablet    ? escitalopram 20 mg tablet Take 1 tablet (20 mg total) by mouth daily.   ? METOPROLOL SUCCINATE 50 mg 24 hr tablet TAKE 1 TABLET (50 MG TOTAL) BY MOUTH DAILY.   ? VERQUVO 10 MG TABS    ? zolpidem 10 mg tablet TAKE 1 TABLET BY MOUTH AT BEDTIME AS NEEDED.   ? atorvastatin (LIPITOR) 40 mg tablet  (Patient not taking: Reported on 01/12/2021.)   ? ramipril (ALTACE) 2.5 mg capsule  (Patient not taking: Reported on 01/12/2021.)   ? tadalafil (CIALIS) 20 mg tablet TAKE 1 TABLET BY MOUTH EVERY DAY AS NEEDED. (Patient not taking: Reported on 10/12/2021.)     No facility-administered medications prior to visit.       REVIEW OF SYSTEMS:  A 14 point review of systems was negative except as noted in HPI.     PHYSICAL EXAMINATION:  BP 90/40  ~ Pulse (!) 41  ~ Temp 36.5 ?C (97.7 ?F) (Temporal)  ~ Resp 17  ~ Ht 5'  6'' (1.676 m)  ~ Wt 151 lb 3.2 oz (68.6 kg)  ~ SpO2 98%  ~ BMI 24.40 kg/m?       General: well developed well nourished in no acute distress  Eyes: extraocular movements intact, anicteric  Neck: JVP 3-4 cm  CV: Regular rate and rhythm, normal S1, S2. no rubs, murmurs, or gallops. Bilateral radial and PT pulses 2+  Resp: lungs clear to auscultation bilaterally without wheezes or crackles  MSK: extremities without clubbing or cyanosis.  No LE edema  Derm: warm, dry , and well perfused  Neuro: grossly non focal                       DATA:  --ECG 01/12/2021: sinus rhythm with incomplete LBBB. Frequent PVC's of 2 morphologies. PVC 1 is Left bundle V2 transition superior axis. PVC 2 is right bundle w shape in V1 and inferior axis.   --ECG 10/26/2019: sinus rhythm at 77 bpm with frequent PVC's. Incomplete RBBB    --Echo 02/09/2012: EF 38%    --Cors Report 07/01/20 (Dr. Glean Hess):  No evidence of coronary artery disease.  Normal right and left heart filling pressures.  Severely reduced left ventricular ejection fraction, dilated left ventricle with severe global hypokinesis.     --Cardiac MRI 04/15/2020: Dilated left ventricular cardiomyopathy with ventricular dilation and depressed systolic function (LVEF = 26%). Associated midcavity, mid myocardial delayed enhancement in the anterior septum and inferoseptal compatible with fibrosis. Right ventricular dilation (RVEDVi: 182.4ml/m2) with depressed systolic function (RVEF = 13%) Mild aortic insufficiency (regurgitant fraction = 2.8%)    --PET cardiac 08/25/21:  IMPRESSION:   1. No evidence of cardiac inflammatory changes.  2. Dilated ventricle with global hypokinesis and akinesis of the interior, septal and distal wall. Ejection fraction of 15%  3. Inferior wall perfusion defect likely correlating with scar tissue described on cardiac MR cardiac of 04/15/2020.  4. PET-CT scan demonstrates no evidence of active extra-cardiac inflammatory changes.    ECG and 12-lead rhythm strip 10/12/21:  Frequent PVCs (~50%) in bigeminal pattern with at least 2 morphologies as previous described.    ICD interrogation 01/11/22: VVI 40, appropriate device function, since 12/01/21: 0 HVR events, 1 episode NSVT,  99.8%VS/0.2% VP, battery life estimated 4.4 years     ASSESSMENT:  Christian Russo is a 56 y.o. male with nonischemic cardiomyopathy status post ICD with frequent PVC's. In recurrent heart failure with septal mid myocardial scar on cardiac MRI. PET scan negative for active inflammation.    1. Nonischemic Cardiomyopathy  1. Unclear etiology. Possibly viral induced?  2. EF 38% on Echo 02/09/12 2013  3. EF 26% with anteroseptal and inferoseptal midmyocardial DE on CMR in 04/15/20  4. Cors neg 07/01/20  5. EF 15% on PET 08/25/21 (no inflammation)  2. Frequent Symptomatic PVC's  1. PVC ablation attempt 2013  2. PVC ablation attempt 12/01/2021 - mapped to 2 morphologies around LVOT and inferoseptal LV, aborted due to proximity to conduction system/AVN necessitating need for (ideally) biventricular pacing  3. S/P MDT SC ICD  1. Original implant 2009 for primary prevention initially, with history of inappropriate ICD shock for AF in 2013  2. Gen change in 12/02/16  4. Paroxsymal AF  1. CHADSVASC 1  2. reviously on apixaban until 08/2021 stopped in the setting of posterior vitreous hemorrhage with his cardiologist approval  5. NS IVCD vs Atypical LBBB (QRSd 136 in 10/2021)    Pt overall is doing reasonable. Still has frequent palpitations  likely from PVCs that still continue. No ICD shocks. Still remains active. He has discussed next steps with his outside cardiologist Dr. Glean Hess who suggested going with amiodarone. We discussed that likely he may still need CRT pacing given he is LVEF <35% with a developing LBBB with QRS duration approaching 130s.  He is interested in this, and most likely will have this done with Dr. Glean Hess. We discussed that a follow-up ~2 months after device implantation could be done to consider PVC ablation at that time.    RECOMMENDATIONS:  1. Pt to follow-up with his personal cardiologist Dr. Glean Hess to discuss amio and CRT upgrade  2. Pt will contact us again after the above to discuss consideration for PVC ablation.     The patient was seen and discussed with Dr. Talbert Forest.    Sheryle Spray MD  Cardiac Electrophysiology Fellow  [facility]

## 2022-01-14 DIAGNOSIS — I4729 NSVT (nonsustained ventricular tachycardia) (HCC/RAF): Secondary | ICD-10-CM

## 2022-01-14 DIAGNOSIS — Z95810 Presence of automatic (implantable) cardiac defibrillator: Secondary | ICD-10-CM

## 2022-01-14 DIAGNOSIS — I493 Ventricular premature depolarization: Secondary | ICD-10-CM

## 2022-01-14 NOTE — Progress Notes
SEE MEDIA SECTION FOR ACTUAL PRINTOUT    DATE OF SERVICE: 01/11/2022    Manufacturer: [employer]onic   Model: Visia AF MRI VR [id number]  Serial # [id number]  Implant Date: 12/02/2016  Lead: [id number] Sprint Quattro Secure    Remaining Longevity: 4.4 years    Underlying rhythm: V sense, irregular 56-130 bpm    Thresholds:  R wave: 11.3 mV  RV lead impedance: 475 ohms  RV/SVC impedance: 45/70 ohms   RV capture voltage: 2.25 V @ 1.0 ms    Since 12/01/2021:  Afib/AFL/SVT episodes: AT/AF x729 for possible normal sinus with frequent PVC's.   VT/Vfib episodes: NSVT detected on 12/10/2021 at 1601 hrs.   PVC Burden:   PVC Singles: 489.3/per hour  PVC Runs: 67.1/per hour  VP = 02%    Settings:   VVI 40 ppm    Changes made this session: None, temporarily reprogrammed for testing purposes only.    Impression:   1. Normal ICD function.   2. NSVT as noted above. Please refer to scanned report for further details.   3. PVC burden: PVC Singles 489.3/per hour, PVC Runs 67.1/per hour.     Plan:   1. Patient to see Dr. Talbert Forest today with prinout. Please see separate EP note.  2. Return to device clinic in accordance with Dr. Frutoso Chase clinical note, with interim remote transmission.

## 2022-06-08 IMAGING — DX DG CHEST 2V
2 series · 2 of 2 positions shown · non-contrast
Comparison: 12/04/2016

CLINICAL DATA: Shortness of breath and chest pain

EXAM:
CHEST - 2 VIEW

[chest pa]
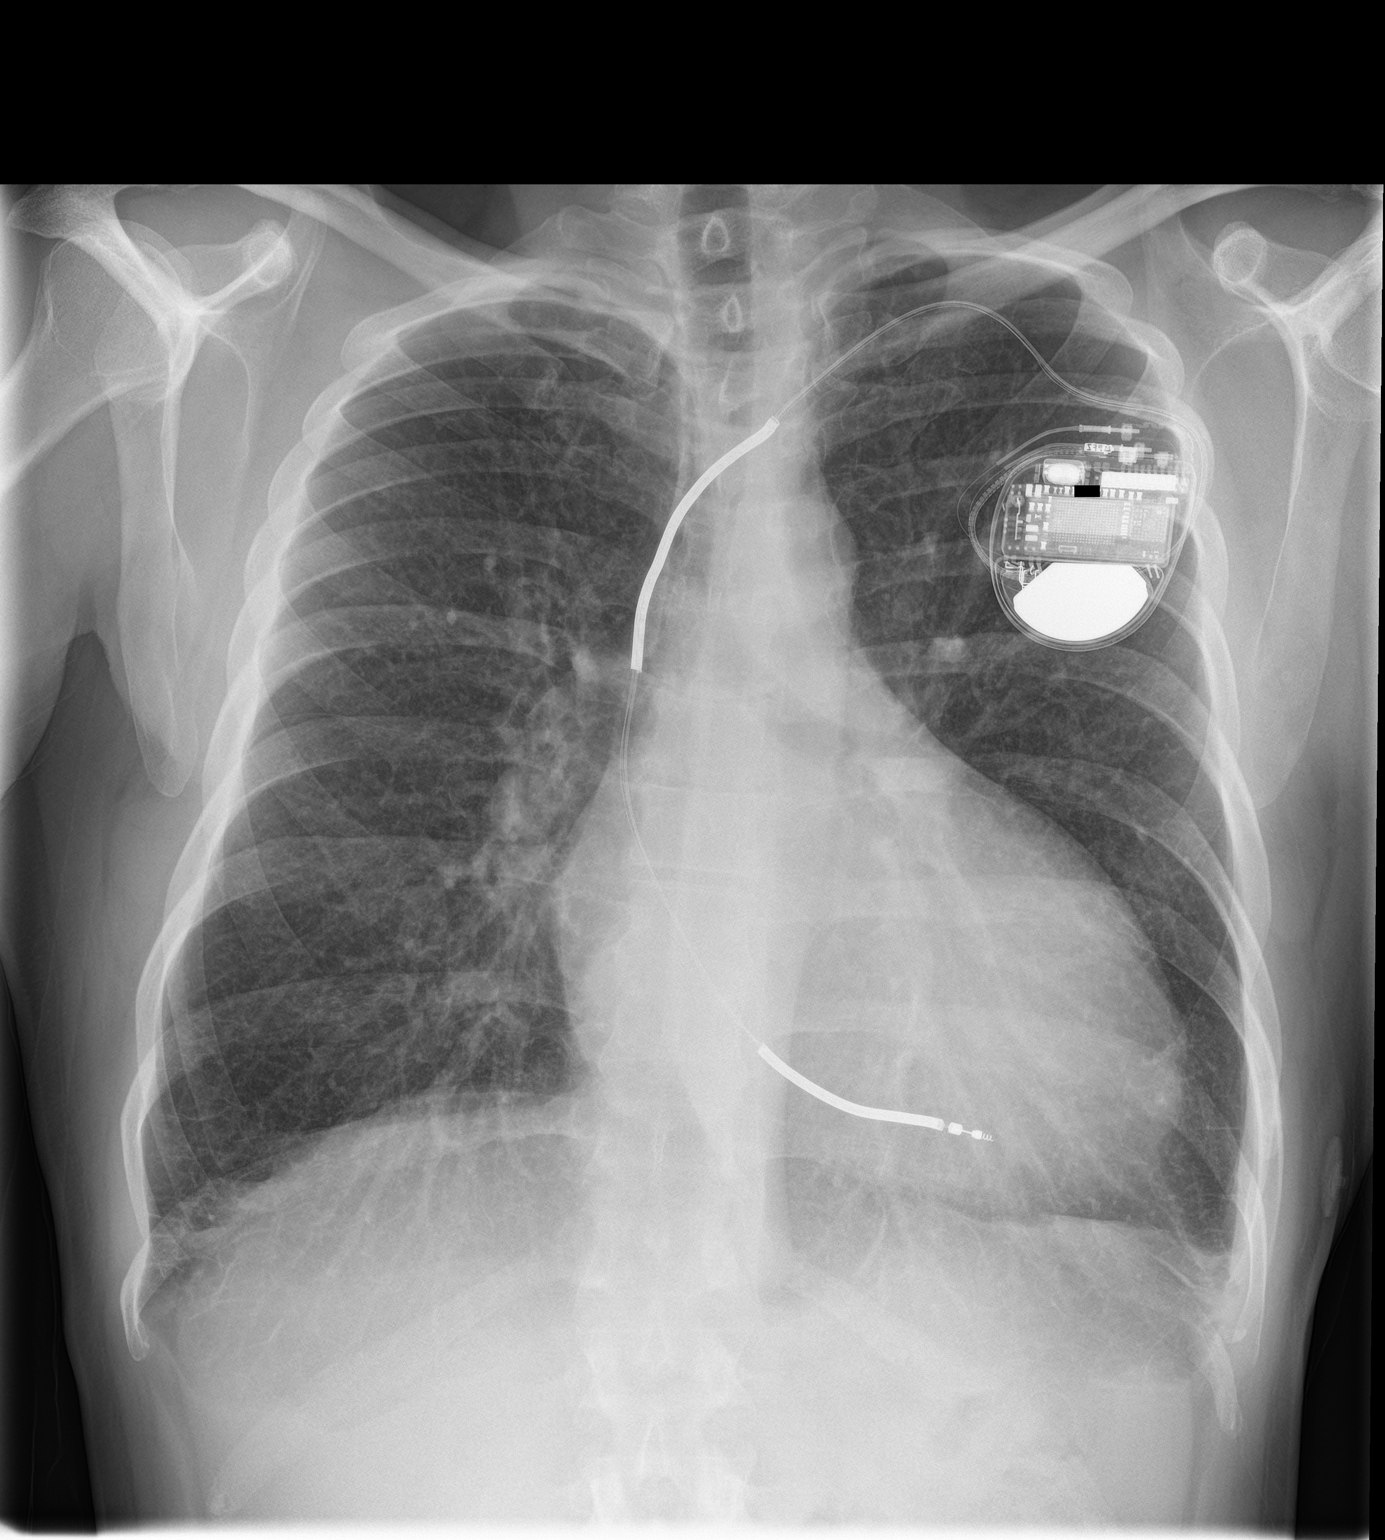

[chest lat]
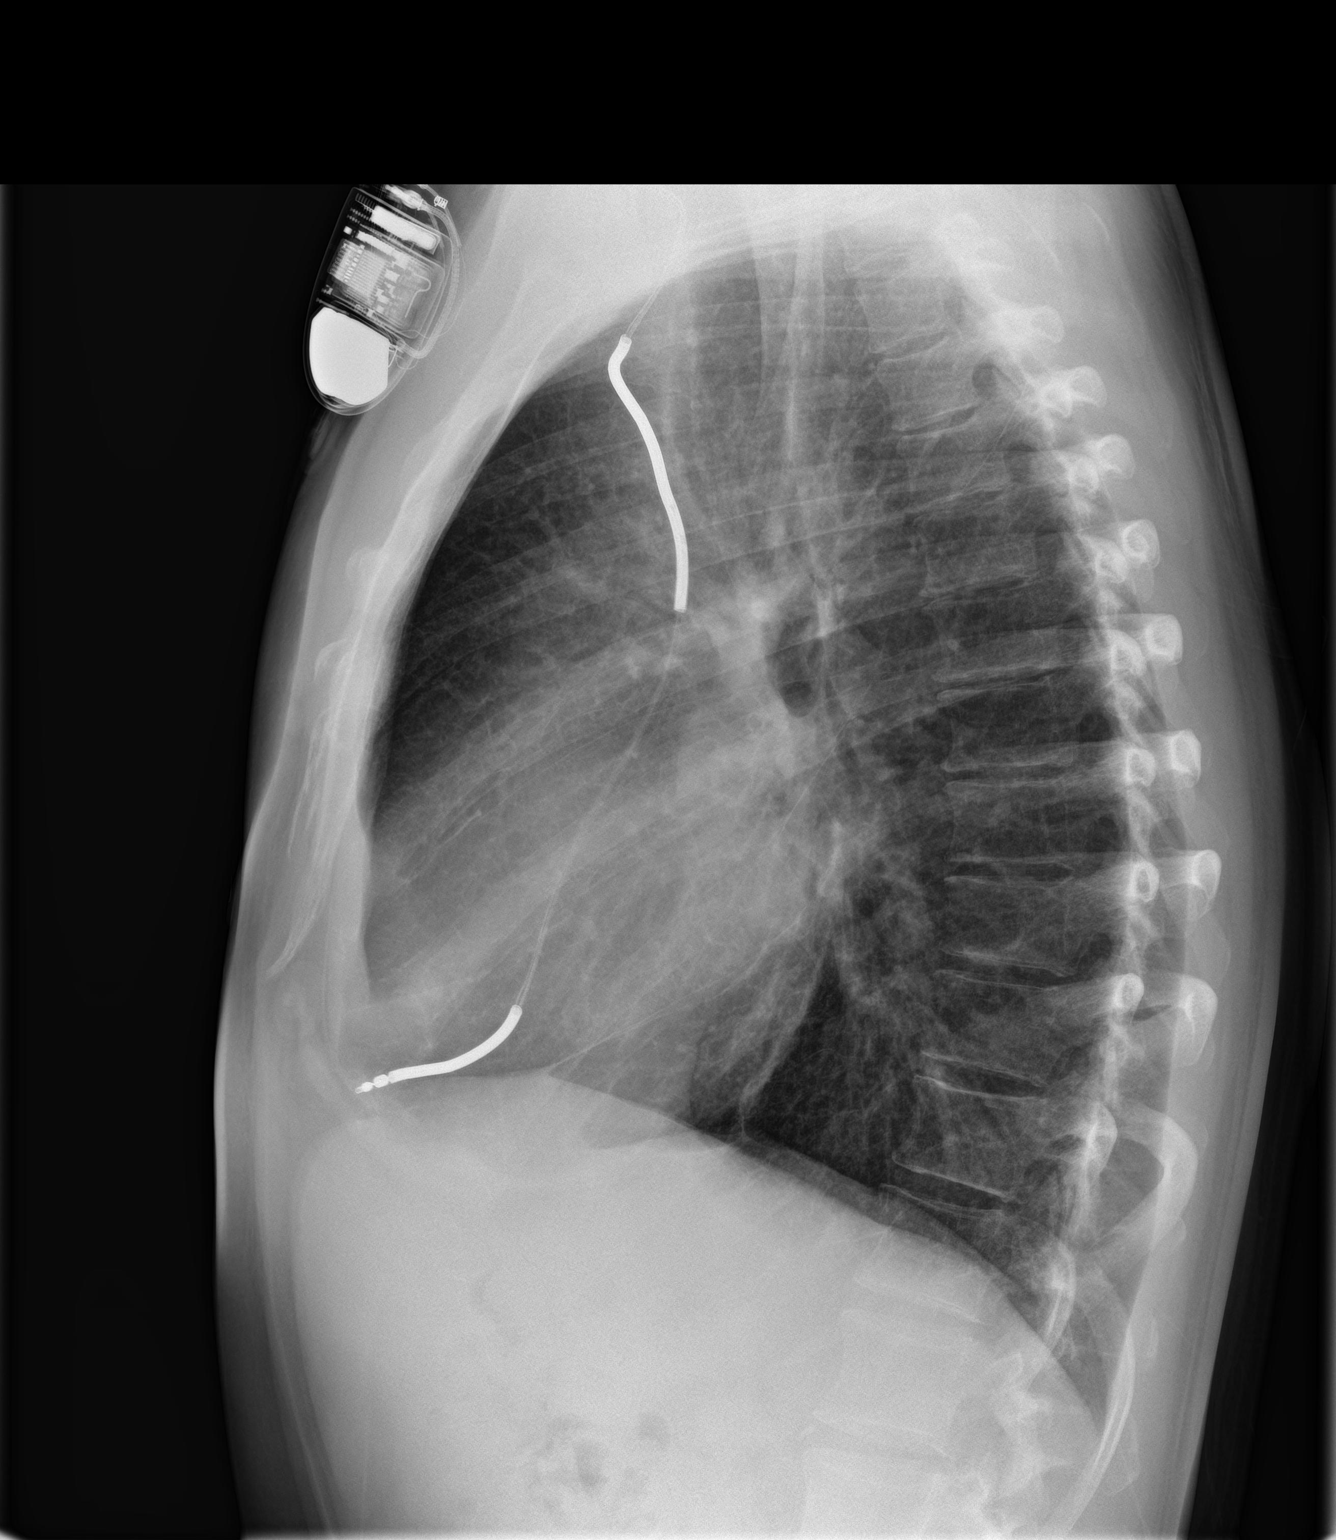

[2 of 2 positions shown; findings below may reference images not displayed]

FINDINGS: Cardiac shadow is at the upper limits of normal in size. The lungs
are well aerated bilaterally. No focal infiltrate or sizable
effusion is seen. Defibrillator is again noted and stable.
IMPRESSION: No acute abnormality noted.

## 2024-04-02 DEATH — deceased
